# Patient Record
Sex: Male | Born: 1980
Health system: Southern US, Community
[De-identification: ages and names within clinical notes are randomized; demographics above are authoritative.]

## PROBLEM LIST (undated history)

## (undated) DIAGNOSIS — E0500 Thyrotoxicosis with diffuse goiter without thyrotoxic crisis or storm: Secondary | ICD-10-CM

## (undated) DIAGNOSIS — R002 Palpitations: Secondary | ICD-10-CM

## (undated) DIAGNOSIS — E785 Hyperlipidemia, unspecified: Secondary | ICD-10-CM

## (undated) DIAGNOSIS — E039 Hypothyroidism, unspecified: Secondary | ICD-10-CM

## (undated) HISTORY — DX: Thyrotoxicosis with diffuse goiter without thyrotoxic crisis or storm: E05.00

## (undated) HISTORY — PX: CYST REMOVAL TRUNK: SHX6283

## (undated) HISTORY — DX: Hyperlipidemia, unspecified: E78.5

## (undated) HISTORY — DX: Hypothyroidism, unspecified: E03.9

## (undated) HISTORY — DX: Palpitations: R00.2

---

## 2004-06-01 HISTORY — PX: OTHER SURGICAL HISTORY: SHX169

## 2009-05-20 ENCOUNTER — Ambulatory Visit: Payer: Self-pay | Admitting: Cardiology

## 2012-02-02 ENCOUNTER — Emergency Department (HOSPITAL_COMMUNITY)
Admission: EM | Admit: 2012-02-02 | Discharge: 2012-02-03 | Disposition: A | Discharge status: Home / Self Care | Payer: 59 | Attending: Emergency Medicine | Admitting: Emergency Medicine

## 2012-02-02 ENCOUNTER — Encounter (HOSPITAL_COMMUNITY): Payer: Self-pay | Admitting: Emergency Medicine

## 2012-02-02 ENCOUNTER — Emergency Department (HOSPITAL_COMMUNITY)
Admission: EM | Admit: 2012-02-02 | Discharge: 2012-02-02 | Disposition: A | Discharge status: Nursing Home (Includes ICF) | Payer: 59 | Source: Home / Self Care

## 2012-02-02 DIAGNOSIS — T148 Other injury of unspecified body region: Secondary | ICD-10-CM | POA: Insufficient documentation

## 2012-02-02 DIAGNOSIS — W57XXXA Bitten or stung by nonvenomous insect and other nonvenomous arthropods, initial encounter: Secondary | ICD-10-CM | POA: Insufficient documentation

## 2012-02-02 DIAGNOSIS — R51 Headache: Secondary | ICD-10-CM | POA: Insufficient documentation

## 2012-02-02 LAB — BASIC METABOLIC PANEL
BUN: 11 mg/dL (ref 6–23)
CO2: 26 mEq/L (ref 19–32)
Calcium: 9.9 mg/dL (ref 8.4–10.5)
Creatinine, Ser: 1.11 mg/dL (ref 0.50–1.35)
Glucose, Bld: 94 mg/dL (ref 70–99)

## 2012-02-02 LAB — CBC WITH DIFFERENTIAL/PLATELET
Eosinophils Relative: 1 % (ref 0–5)
HCT: 43.5 % (ref 39.0–52.0)
Lymphocytes Relative: 31 % (ref 12–46)
Lymphs Abs: 1.7 10*3/uL (ref 0.7–4.0)
MCV: 87.9 fL (ref 78.0–100.0)
Monocytes Absolute: 0.5 10*3/uL (ref 0.1–1.0)
Monocytes Relative: 10 % (ref 3–12)
RBC: 4.95 MIL/uL (ref 4.22–5.81)
WBC: 5.5 10*3/uL (ref 4.0–10.5)

## 2012-02-02 LAB — HEPATIC FUNCTION PANEL
Albumin: 4.3 g/dL (ref 3.5–5.2)
Alkaline Phosphatase: 43 U/L (ref 39–117)
Total Bilirubin: 0.5 mg/dL (ref 0.3–1.2)

## 2012-02-02 MED ORDER — DOXYCYCLINE HYCLATE 100 MG PO TABS
100.0000 mg | ORAL_TABLET | Freq: Once | ORAL | Status: AC
  Administered 2012-02-02: 100 mg via ORAL
  Filled 2012-02-02: qty 1

## 2012-02-02 MED ORDER — SODIUM CHLORIDE 0.9 % IV SOLN
1000.0000 mL | Freq: Once | INTRAVENOUS | Status: AC
  Administered 2012-02-02: 1000 mL via INTRAVENOUS

## 2012-02-02 MED ORDER — KETOROLAC TROMETHAMINE 30 MG/ML IJ SOLN
30.0000 mg | Freq: Once | INTRAMUSCULAR | Status: AC
  Administered 2012-02-02: 30 mg via INTRAVENOUS
  Filled 2012-02-02: qty 1

## 2012-02-02 MED ORDER — ONDANSETRON HCL 4 MG/2ML IJ SOLN
4.0000 mg | Freq: Once | INTRAMUSCULAR | Status: AC
  Administered 2012-02-02: 4 mg via INTRAVENOUS
  Filled 2012-02-02: qty 2

## 2012-02-02 MED ORDER — SODIUM CHLORIDE 0.9 % IV SOLN: 1000.0000 mL | INTRAVENOUS | Status: DC

## 2012-02-02 MED ORDER — MORPHINE SULFATE 4 MG/ML IJ SOLN
4.0000 mg | Freq: Once | INTRAMUSCULAR | Status: AC
  Administered 2012-02-02: 4 mg via INTRAVENOUS
  Filled 2012-02-02: qty 1

## 2012-02-02 NOTE — ED Notes (Signed)
C/o headache and nausea, backache.  Onset Friday of these symptoms.  Denies vomiting.  No diarrhea.  Reports generalized soreness.  Unknown how high temperature was running.  Denies rash, remembers removing tick from himself in the last 2 weeks.

## 2012-02-02 NOTE — ED Provider Notes (Signed)
History     CSN: [id number]  Arrival date & time 02/02/12  1542   None     Chief Complaint  Patient presents with  . Headache    (Consider location/radiation/quality/duration/timing/severity/associated sxs/prior treatment) HPI Comments: 4 d ago started with a severe global headache f/b neck stiffness , nausea, decreased appetite, body aches, nausea, chills. St headache is worse he has ever, shooting, stabbing, throbbing with photophobia. Never had hx of headaches and considers himself to be in good overall health.   Patient is a 31 y.o. male presenting with headaches.  Headache The primary symptoms include headaches, fever and nausea. Primary symptoms do not include seizures or dizziness.  The headache is associated with photophobia, eye pain and neck stiffness.  Additional symptoms include neck stiffness and photophobia. Additional symptoms do not include hearing loss or tinnitus.    History reviewed. No pertinent past medical history.  Past Surgical History  Procedure Date  . Cyst removed 2006    No family history on file.  History  Substance Use Topics  . Smoking status: Never Smoker   . Smokeless tobacco: Not on file  . Alcohol Use: No      Review of Systems  Constitutional: Positive for fever, chills, activity change, appetite change and fatigue.  HENT: Positive for neck pain and neck stiffness. Negative for hearing loss, ear pain, nosebleeds, congestion, rhinorrhea, sneezing, postnasal drip and tinnitus.   Eyes: Positive for photophobia and pain.  Respiratory: Negative.   Cardiovascular: Negative.   Gastrointestinal: Positive for nausea. Negative for abdominal pain, constipation, blood in stool, abdominal distention and anal bleeding.  Genitourinary: Negative.   Musculoskeletal: Positive for myalgias and back pain.  Neurological: Positive for headaches. Negative for dizziness, seizures, syncope, speech difficulty, light-headedness and numbness.  Hematological:  Negative.   Psychiatric/Behavioral: Negative.     Allergies  Review of patient's allergies indicates no known allergies.  Home Medications   Current Outpatient Rx  Name Route Sig Dispense Refill  . IBUPROFEN 200 MG PO TABS Oral Take 200 mg by mouth every 6 (six) hours as needed.    . IMITREX PO Oral Take by mouth.      BP 99/65  Pulse 84  Temp 98.2 F (36.8 C) (Oral)  Resp 16  SpO2 100%  Physical Exam  Constitutional: He is oriented to person, place, and time. He appears well-developed and well-nourished. No distress.  HENT:  Right Ear: External ear normal.  Left Ear: External ear normal.  Mouth/Throat: Oropharynx is clear and moist.  Eyes: Conjunctivae are normal. Pupils are equal, round, and reactive to light.  Neck: No rigidity. Decreased range of motion present. No edema present. No Brudzinski's sign and no Kernig's sign noted.  Cardiovascular: Normal rate, regular rhythm and normal heart sounds.   Pulmonary/Chest: Breath sounds normal. He is in respiratory distress.  Neurological: He is alert and oriented to person, place, and time. No cranial nerve deficit.  Skin: Skin is warm and dry. He is not diaphoretic.  Psychiatric: He has a normal mood and affect.    ED Course  Procedures (including critical care time)  Labs Reviewed - No data to display No results found.   1. Headache, acute       MDM  Due to headache status, recent tick bite and associated sx's that may give concern for rickettsial illness or meningitis transfer via shuttle to ED. Condition is stable.         Brandon Rasmussen, NP 02/02/12 1704

## 2012-02-02 NOTE — ED Notes (Signed)
Pt sent here for eval for nausea, HA, neck stiffness and fever; pt removed tick in last 2 weeks; no rash noted

## 2012-02-03 MED ORDER — HYDROCODONE-ACETAMINOPHEN 5-500 MG PO TABS: 1.0000 | ORAL_TABLET | Freq: Four times a day (QID) | ORAL | Status: AC | PRN

## 2012-02-03 MED ORDER — DOXYCYCLINE HYCLATE 100 MG PO CAPS: 100.0000 mg | ORAL_CAPSULE | Freq: Two times a day (BID) | ORAL | Status: AC

## 2012-02-03 MED ORDER — OMEPRAZOLE 20 MG PO CPDR: 20.0000 mg | DELAYED_RELEASE_CAPSULE | Freq: Every day | ORAL | Status: DC

## 2012-02-03 NOTE — ED Provider Notes (Signed)
Medical screening examination/treatment/procedure(s) were performed by resident physician or non-physician practitioner and as supervising physician I was immediately available for consultation/collaboration.   Barkley Bruns MD.    Linna Hoff, MD 02/03/12 2130

## 2012-02-03 NOTE — ED Provider Notes (Signed)
History     CSN: [id number]  Arrival date & time 02/02/12  1709   First MD Initiated Contact with Patient 02/02/12 2129      Chief Complaint  Patient presents with  . Fever  . Headache  . Nausea  . Insect Bite    (Consider location/radiation/quality/duration/timing/severity/associated sxs/prior treatment) The history is provided by the patient.   the patient reports developing nausea and headache with fever over the past several days.  He had a tick bite 2 weeks ago.  Said no rash.  He was seen at the urgent care and sent to the emergency department for evaluation for possible meningitis versus [facility] spotted fever.  The patient denies abdominal pain.  He has no chest pain or shortness of breath.  Said no dysuria.  He has no medical problems.  History reviewed. No pertinent past medical history.  Past Surgical History  Procedure Date  . Cyst removed 2006    History reviewed. No pertinent family history.  History  Substance Use Topics  . Smoking status: Never Smoker   . Smokeless tobacco: Not on file  . Alcohol Use: No      Review of Systems  Constitutional: Positive for fever.  Neurological: Positive for headaches.  All other systems reviewed and are negative.    Allergies  Review of patient's allergies indicates no known allergies.  Home Medications   Current Outpatient Rx  Name Route Sig Dispense Refill  . IBUPROFEN 200 MG PO TABS Oral Take 400 mg by mouth every 6 (six) hours as needed. For pain    . DOXYCYCLINE HYCLATE 100 MG PO CAPS Oral Take 1 capsule (100 mg total) by mouth 2 (two) times daily. 20 capsule 0  . HYDROCODONE-ACETAMINOPHEN 5-500 MG PO TABS Oral Take 1 tablet by mouth every 6 (six) hours as needed for pain. 15 tablet 0  . OMEPRAZOLE 20 MG PO CPDR Oral Take 1 capsule (20 mg total) by mouth daily. 30 capsule 0    BP 125/64  Pulse 79  Temp 98.3 F (36.8 C) (Oral)  Resp 18  SpO2 97%  Physical Exam  Nursing note and vitals  reviewed. Constitutional: He is oriented to person, place, and time. He appears well-developed and well-nourished.  HENT:  Head: Normocephalic and atraumatic.  Eyes: EOM are normal.  Neck: Normal range of motion. Neck supple.       No meningeal signs  Cardiovascular: Normal rate, regular rhythm, normal heart sounds and intact distal pulses.   Pulmonary/Chest: Effort normal and breath sounds normal. No respiratory distress.  Abdominal: Soft. He exhibits no distension. There is no tenderness.  Musculoskeletal: Normal range of motion.  Neurological: He is alert and oriented to person, place, and time.  Skin: Skin is warm and dry.  Psychiatric: He has a normal mood and affect. Judgment normal.    ED Course  Procedures (including critical care time)  Labs Reviewed  BASIC METABOLIC PANEL - Abnormal; Notable for the following:    GFR calc non Af Amer 86 (*)     All other components within normal limits  CBC WITH DIFFERENTIAL  HEPATIC FUNCTION PANEL   No results found.   1. Headache   2. Tick bite       MDM  This may represent [facility] spotted fever however he has no rash he has a thrombocytopenia and his liver enzymes are normal.  Regardless the patient be covered with doxycycline for 10 days.  Close PCP followup.  This is  not meningitis.  The patient is well-appearing.  Feels better at this time after fluids and pain medicine        Lyanne Co, MD 02/03/12 [phone]

## 2012-05-23 ENCOUNTER — Encounter: Payer: Self-pay | Admitting: Cardiology

## 2012-06-03 ENCOUNTER — Ambulatory Visit (INDEPENDENT_AMBULATORY_CARE_PROVIDER_SITE_OTHER): Payer: 59 | Admitting: Cardiology

## 2012-06-03 ENCOUNTER — Encounter: Payer: Self-pay | Admitting: Cardiology

## 2012-06-03 VITALS — BP 122/77 | HR 74 | Ht 64.0 in | Wt 173.4 lb

## 2012-06-03 DIAGNOSIS — Z136 Encounter for screening for cardiovascular disorders: Secondary | ICD-10-CM

## 2012-06-03 DIAGNOSIS — R002 Palpitations: Secondary | ICD-10-CM | POA: Insufficient documentation

## 2012-06-03 NOTE — Patient Instructions (Addendum)
Your physician recommends that you schedule a follow-up appointment as needed.   Your physician recommends that you continue on your current medications as directed. Please refer to the Current Medication list given to you today.  

## 2012-06-03 NOTE — Progress Notes (Signed)
   HPI The patient presents for evaluation of palpitations. He has no prior cardiac history. He has had palpitations for years. He thinks this is a stable condition. He has had palpitations sporadically. He may have a day where he has a couple of them and then not have them for a long while. He notices them more when he is less active. He describes isolated skipped beats or a sensation of a pause. However, he doesn't seem to have multiple episodes of this. He's never had any presyncope or syncope. He denies chest pressure, neck or arm discomfort. He's actually intentionally lost 20 pounds. He's had no edema. He actually did a mud race recently without problems.  I did review labs and he had a normal TSH recently and normal electrolytes. I reviewed an ER report from [facility] and he had normal labs. He was being treated for presumed [facility] Fever at that time.  No Known Allergies  Current Outpatient Prescriptions  Medication Sig Dispense Refill  . ibuprofen (ADVIL,MOTRIN) 200 MG tablet Take 400 mg by mouth every 6 (six) hours as needed. For pain        Past Medical History  Diagnosis Date  . Palpitations     Past Surgical History  Procedure Date  . Cyst removed 2006    ROS:  As stated in the HPI and negative for all other systems.  PHYSICAL EXAM BP 122/77  Ht 5\' 4"  (1.626 m)  Wt 173 lb 6.4 oz (78.654 kg)  BMI 29.76 kg/m2 GENERAL:  Well appearing HEENT:  Pupils equal round and reactive, fundi not visualized, oral mucosa unremarkable NECK:  No jugular venous distention, waveform within normal limits, carotid upstroke brisk and symmetric, no bruits, no thyromegaly LYMPHATICS:  No cervical, inguinal adenopathy LUNGS:  Clear to auscultation bilaterally BACK:  No CVA tenderness CHEST:  Unremarkable HEART:  PMI not displaced or sustained,S1 and S2 within normal limits, no S3, no S4, no clicks, no rubs, no murmurs ABD:  Flat, positive bowel sounds normal in frequency in pitch, no  bruits, no rebound, no guarding, no midline pulsatile mass, no hepatomegaly, no splenomegaly EXT:  2 plus pulses throughout, no edema, no cyanosis no clubbing SKIN:  No rashes no nodules NEURO:  Cranial nerves II through XII grossly intact, motor grossly intact throughout PSYCH:  Cognitively intact, oriented to person place and time  EKG:  Sinus rhythm, rate 74, axis within normal limits, intervals within normal limits, no acute ST-T wave changes.  06/03/2012   ASSESSMENT AND PLAN  Palpitations:  I had a long conversation with him about these. These are infrequent. I would otherwise suspect that he has a normal heart. He is to avoid caffeine. Otherwise I would not suggest further cardiovascular workup. He would like to avoid symptomatic treatment at this point. He will however let me know if he has worsening symptoms in the future.  Risk reduction:  I did discuss with him continuing an exercise regimen. Further primary risk reduction will be per Dr. Loney Hering.

## 2017-05-11 ENCOUNTER — Other Ambulatory Visit: Payer: Self-pay

## 2017-05-11 ENCOUNTER — Emergency Department (HOSPITAL_COMMUNITY): Payer: Worker's Compensation

## 2017-05-11 ENCOUNTER — Emergency Department (HOSPITAL_COMMUNITY)
Admission: EM | Admit: 2017-05-11 | Discharge: 2017-05-11 | Disposition: A | Discharge status: Home / Self Care | Payer: Worker's Compensation | Attending: Emergency Medicine | Admitting: Emergency Medicine

## 2017-05-11 ENCOUNTER — Encounter (HOSPITAL_COMMUNITY): Payer: Self-pay | Admitting: Emergency Medicine

## 2017-05-11 DIAGNOSIS — Y9301 Activity, walking, marching and hiking: Secondary | ICD-10-CM | POA: Insufficient documentation

## 2017-05-11 DIAGNOSIS — W010XXA Fall on same level from slipping, tripping and stumbling without subsequent striking against object, initial encounter: Secondary | ICD-10-CM | POA: Diagnosis not present

## 2017-05-11 DIAGNOSIS — S42021A Displaced fracture of shaft of right clavicle, initial encounter for closed fracture: Secondary | ICD-10-CM | POA: Diagnosis not present

## 2017-05-11 DIAGNOSIS — Y990 Civilian activity done for income or pay: Secondary | ICD-10-CM | POA: Diagnosis not present

## 2017-05-11 DIAGNOSIS — Y9289 Other specified places as the place of occurrence of the external cause: Secondary | ICD-10-CM | POA: Insufficient documentation

## 2017-05-11 DIAGNOSIS — S4991XA Unspecified injury of right shoulder and upper arm, initial encounter: Secondary | ICD-10-CM | POA: Diagnosis present

## 2017-05-11 MED ORDER — ONDANSETRON 4 MG PO TBDP: 4.0000 mg | ORAL_TABLET | Freq: Three times a day (TID) | ORAL | 0 refills | Status: DC | PRN

## 2017-05-11 MED ORDER — HYDROCODONE-ACETAMINOPHEN 5-325 MG PO TABS: 1.0000 | ORAL_TABLET | ORAL | 0 refills | Status: DC | PRN

## 2017-05-11 MED ORDER — OXYCODONE-ACETAMINOPHEN 5-325 MG PO TABS
2.0000 | ORAL_TABLET | ORAL | Status: DC | PRN
Start: 2017-05-11 — End: 2017-05-11

## 2017-05-11 MED ORDER — OXYCODONE-ACETAMINOPHEN 5-325 MG PO TABS
1.0000 | ORAL_TABLET | ORAL | Status: DC | PRN
  Administered 2017-05-11: 1 via ORAL
  Filled 2017-05-11: qty 1

## 2017-05-11 MED FILL — ONDANSETRON ODT 4 MG TABLET: 4 | 7 days supply | Qty: 20 | Fill #0

## 2017-05-11 MED FILL — HYDROCODON-APAP 5-325: 5-325 | 3 days supply | Qty: 15 | Fill #0

## 2017-05-11 NOTE — ED Triage Notes (Signed)
Patient from work where he slipped on ice and fell on his right shoulder. Apparent deformity at right clavicle, unable to lift right arm due to pain. PMS intact distal to shoulder. Denies other complains, no LOC. Patient alert and oriented and no in apparent distress at this time.

## 2017-05-11 NOTE — ED Notes (Signed)
Ortho tech paged for arm sling. 

## 2017-05-11 NOTE — ED Provider Notes (Signed)
[facility]   CSN: [id number] Arrival date & time: 05/11/17  0740     History   Chief Complaint Chief Complaint  Patient presents with  . Fall    HPI Maxwell Marionimothy L Wrobleski is a 36 y.o. male.  HPI   Very pleasant 36 year old male here with right shoulder pain after fall.  The patient was walking into work at his workplace when he slipped, falling onto his right shoulder.  He reports a popping sensation and immediate onset of shoulder deformity and pain.  The pain is aching, throbbing, worse with any movement or palpation.  He denies any associated numbness or tingling down his arm.  He did have some transient tingling in his ulnar nerve distribution, but denies any elbow pain.  Denies any neck pain.  He did not hit his head or lose consciousness.  Denies any illness prior to the fall.  The fall was mechanical in nature.  He is not on blood thinners.  No other medical complaints or issues.  Past Medical History:  Diagnosis Date  . Palpitations     Patient Active Problem List   Diagnosis Date Noted  . Palpitations 06/03/2012    Past Surgical History:  Procedure Laterality Date  . Cyst removed  2006       Home Medications    Prior to Admission medications   Medication Sig Start Date End Date Taking? Authorizing Provider  HYDROcodone-acetaminophen (NORCO/VICODIN) 5-325 MG tablet Take 1-2 tablets by mouth every 4 (four) hours as needed for severe pain. 05/11/17   Shaune PollackIsaacs, Marilene Vath, MD  ondansetron (ZOFRAN ODT) 4 MG disintegrating tablet Take 1 tablet (4 mg total) by mouth every 8 (eight) hours as needed for nausea or vomiting. 05/11/17   Shaune PollackIsaacs, Jas Betten, MD    Family History Family History  Problem Relation Age of Onset  . Thyroid disease Mother     Social History Social History   Tobacco Use  . Smoking status: Never Smoker  Substance Use Topics  . Alcohol use: No  . Drug use: No     Allergies   Patient has no  known allergies.   Review of Systems Review of Systems  Constitutional: Negative for chills, fatigue and fever.  HENT: Negative for congestion and rhinorrhea.   Eyes: Negative for visual disturbance.  Respiratory: Negative for cough, shortness of breath and wheezing.   Cardiovascular: Negative for chest pain and leg swelling.  Gastrointestinal: Negative for abdominal pain, diarrhea, nausea and vomiting.  Genitourinary: Negative for dysuria and flank pain.  Musculoskeletal: Positive for arthralgias and myalgias. Negative for neck pain and neck stiffness.  Skin: Negative for rash and wound.  Allergic/Immunologic: Negative for immunocompromised state.  Neurological: Negative for syncope, weakness and headaches.  All other systems reviewed and are negative.    Physical Exam Updated Vital Signs BP 110/84   Pulse 74   Temp (!) 97.2 F (36.2 C) (Oral)   Resp 12   SpO2 98%   Physical Exam  Constitutional: He is oriented to person, place, and time. He appears well-developed and well-nourished. No distress.  HENT:  Head: Normocephalic and atraumatic.  Eyes: Conjunctivae are normal.  Neck: Neck supple.  Cardiovascular: Normal rate, regular rhythm and normal heart sounds. Exam reveals no friction rub.  No murmur heard. Pulmonary/Chest: Effort normal and breath sounds normal. No respiratory distress. He has no wheezes. He has no rales. He exhibits no tenderness (No chest wall tenderness).  Abdominal: He exhibits no distension.  Musculoskeletal: He exhibits no edema.  Neurological: He is alert and oriented to person, place, and time. He exhibits normal muscle tone.  Skin: Skin is warm. Capillary refill takes less than 2 seconds.  Psychiatric: He has a normal mood and affect.  Nursing note and vitals reviewed.   UPPER EXTREMITY EXAM: Right  INSPECTION & PALPATION: There is mild deformity to the right mid and distal clavicle.  Skin is not tenting and there are no open wounds.   Minimal ecchymoses.  Range of motion full but mildly painful at the shoulder.  No elbow pain or tenderness.  SENSORY: Sensation is intact to light touch in:  Superficial radial nerve distribution (dorsal first web space) Median nerve distribution (tip of index finger)   Ulnar nerve distribution (tip of small finger)     MOTOR:  + Motor posterior interosseous nerve (thumb IP extension) + Anterior interosseous nerve (thumb IP flexion, index finger DIP flexion) + Radial nerve (wrist extension) + Median nerve (palpable firing thenar mass) + Ulnar nerve (palpable firing of first dorsal interosseous muscle)  VASCULAR: 2+ radial pulse Brisk capillary refill < 2 sec, fingers warm and well-perfused   ED Treatments / Results  Labs (all labs ordered are listed, but only abnormal results are displayed) Labs Reviewed - No data to display  EKG  EKG Interpretation  Date/Time:  Tuesday May 11 2017 09:09:40 EST Ventricular Rate:  74 PR Interval:    QRS Duration: 86 QT Interval:  365 QTC Calculation: 405 R Axis:   75 Text Interpretation:  Sinus rhythm Confirmed by Shaune PollackIsaacs, Meryl Ponder [phone]([id number]) on 05/11/2017 9:22:19 AM       Radiology Dg Chest 2 View  Result Date: 05/11/2017 CLINICAL DATA:  Patient fell on ice today landing on the right shoulder. EXAM: CHEST  2 VIEW COMPARISON:  Chest x-ray of November 18, 2016 and right shoulder series of today's date FINDINGS: The patient has sustained an acute mildly displaced midshaft right clavicular fracture. The observed ribs are intact. The observed portions of the right shoulder otherwise appears normal. The lungs are well-expanded and clear. The heart and pulmonary vascularity are normal. There is no pleural effusion. The observed thoracic spine exhibits no acute abnormality. IMPRESSION: Acute midshaft right clavicular fracture with displacement of approximately 1/5 shaft width. No acute cardiopulmonary abnormality. Electronically Signed   By: David   [place]azil[place]Jordan M.D.   On: 05/11/2017 08:36   Dg Shoulder Right  Result Date: 05/11/2017 CLINICAL DATA:  Larey SeatFell on ice today. EXAM: RIGHT SHOULDER - 2+ VIEW COMPARISON:  Chest x-ray of today's date FINDINGS: There is an acute fracture of the midshaft of the right clavicle. It is comminuted and exhibits minimal displacement. The proximal and distal aspects of the clavicle are intact. The ribs exhibit no acute abnormalities. There is no pneumothorax. The glenohumeral and AC joints appear normal. The humeral head and proximal shaft are intact. IMPRESSION: There is an acute minimally displaced midshaft right clavicular fracture. Electronically Signed   By: David  [place] M.D.   On: 05/11/2017 08:37    Procedures Procedures (including critical care time)  Medications Ordered in ED Medications  oxyCODONE-acetaminophen (PERCOCET/ROXICET) 5-325 MG per tablet 2 tablet (not administered)     Initial Impression / Assessment and Plan / ED Course  I have reviewed the triage vital signs and the nursing notes.  Pertinent labs & imaging results that were available during my care of the patient were reviewed by me and considered in my medical decision making (see chart for details).  36 year old male with past medical history as above who presents with right shoulder pain after mechanical fall.  He is neurovascularly intact.  Imaging shows minimally displaced mid shaft right clavicular fracture.  Will place him in a sling and refer to orthopedics.  No signs of neurovascular compromise.  Chest x-ray is clear.  He does report transient history of palpitations but EKG is nonischemic.  Will have him follow-up with the PCP.  Final Clinical Impressions(s) / ED Diagnoses   Final diagnoses:  Closed displaced fracture of shaft of right clavicle, initial encounter    ED Discharge Orders        Ordered    HYDROcodone-acetaminophen (NORCO/VICODIN) 5-325 MG tablet  Every 4 hours PRN     05/11/17 0939    ondansetron  (ZOFRAN ODT) 4 MG disintegrating tablet  Every 8 hours PRN     05/11/17 0939       Shaune Pollack, MD 05/11/17 [phone]

## 2017-05-11 NOTE — Progress Notes (Signed)
Orthopedic Tech Progress Note Patient Details:  Brandon Chase 21-Feb-1981 [id number]  Ortho Devices Type of Ortho Device: Arm sling Ortho Device/Splint Interventions: Application   Post Interventions Patient Tolerated: Well Instructions Provided: Care of device   Saul FordyceJennifer C Fredrica Capano 05/11/2017, 8:23 AM

## 2017-05-11 NOTE — ED Notes (Signed)
Patient decline ice at this time due to pain

## 2018-08-26 ENCOUNTER — Ambulatory Visit: Payer: 59 | Admitting: Allergy & Immunology

## 2019-10-04 ENCOUNTER — Encounter: Payer: Self-pay | Admitting: Allergy & Immunology

## 2019-10-04 ENCOUNTER — Ambulatory Visit (INDEPENDENT_AMBULATORY_CARE_PROVIDER_SITE_OTHER): Payer: 59 | Admitting: Allergy & Immunology

## 2019-10-04 ENCOUNTER — Other Ambulatory Visit: Payer: Self-pay

## 2019-10-04 VITALS — BP 116/86 | HR 88 | Temp 98.7°F | Resp 18 | Ht 64.0 in | Wt 172.8 lb

## 2019-10-04 DIAGNOSIS — J3089 Other allergic rhinitis: Secondary | ICD-10-CM

## 2019-10-04 DIAGNOSIS — J302 Other seasonal allergic rhinitis: Secondary | ICD-10-CM | POA: Diagnosis not present

## 2019-10-04 MED ORDER — AZELASTINE HCL 0.1 % NA SOLN
2.0000 | Freq: Two times a day (BID) | NASAL | 5 refills | Status: DC
Start: 2019-10-04 — End: 2021-08-28

## 2019-10-04 MED ORDER — MONTELUKAST SODIUM 10 MG PO TABS: 10.0000 mg | ORAL_TABLET | Freq: Every day | ORAL | 5 refills | Status: DC

## 2019-10-04 NOTE — Patient Instructions (Addendum)
1. Seasonal and perennial allergic rhinitis - Testing today showed: grasses, ragweed, weeds, trees, indoor molds, outdoor molds and cat - Copy of test results provided.  - Avoidance measures provided. - Continue with: Flonase (fluticasone) one spray per nostril daily and Singulair (montelukast) 10mg  daily - Start taking: Singulair (montelukast) 10mg  daily and Astelin (azelastine) 2 sprays per nostril 1-2 times daily as needed - You can use an extra dose of the antihistamine, if needed, for breakthrough symptoms.  - Consider nasal saline rinses 1-2 times daily to remove allergens from the nasal cavities as well as help with mucous clearance (this is especially helpful to do before the nasal sprays are given) - Consider allergy shots as a means of long-term control. - Allergy shots "re-train" and "reset" the immune system to ignore environmental allergens and decrease the resulting immune response to those allergens (sneezing, itchy watery eyes, runny nose, nasal congestion, etc).    - Allergy shots improve symptoms in 75-85% of patients.  - Call your insurance company about coverage and make an appointment to start allergy shots.  2. Return in about 6 weeks (around 11/15/2019). This can be an in-person, a virtual Webex or a telephone follow up visit.   Please inform us of any Emergency Department visits, hospitalizations, or changes in symptoms. Call us before going to the ED for breathing or allergy symptoms since we might be able to fit you in for a sick visit. Feel free to contact us anytime with any questions, problems, or concerns.  It was a pleasure to meet you today!  Websites that have reliable patient information: 1. American Academy of Asthma, Allergy, and Immunology: www.aaaai.org 2. Food Allergy Research and Education (FARE): foodallergy.org 3. Mothers of Asthmatics: http://www.asthmacommunitynetwork.org 4. American College of Allergy, Asthma, and Immunology:  www.acaai.org   COVID-19 Vaccine Information can be found at: [email or web address] For questions related to vaccine distribution or appointments, please email [email or web address] or call [phone].     "Like" [place] on Facebook and Instagram for our latest updates!       HAPPY SPRING!  Make sure you are registered to vote! If you have moved or changed any of your contact information, you will need to get this updated before voting!  In some cases, you MAY be able to register to vote online: [email or web address]    Reducing Pollen Exposure  The American Academy of Allergy, Asthma and Immunology suggests the following steps to reduce your exposure to pollen during allergy seasons.    1. Do not hang sheets or clothing out to dry; pollen may collect on these items. 2. Do not mow lawns or spend time around freshly cut grass; mowing stirs up pollen. 3. Keep windows closed at night.  Keep car windows closed while driving. 4. Minimize morning activities outdoors, a time when pollen counts are usually at their highest. 5. Stay indoors as much as possible when pollen counts or humidity is high and on windy days when pollen tends to remain in the air longer. 6. Use air conditioning when possible.  Many air conditioners have filters that trap the pollen spores. 7. Use a HEPA room air filter to remove pollen form the indoor air you breathe.  Control of Mold Allergen   Mold and fungi can grow on a variety of surfaces provided certain temperature and moisture conditions exist.  Outdoor molds grow on plants, decaying vegetation and soil.  The major outdoor mold, Alternaria and Cladosporium, are found in very high numbers during hot and dry conditions.  Generally, a late Summer - Fall peak is seen for common outdoor fungal spores.  Rain will temporarily lower outdoor mold spore count, but counts rise rapidly when  the rainy period ends.  The most important indoor molds are Aspergillus and Penicillium.  Dark, humid and poorly ventilated basements are ideal sites for mold growth.  The next most common sites of mold growth are the bathroom and the kitchen.  Outdoor (Seasonal) Mold Control  Positive outdoor molds via skin testing: Alternaria, Cladosporium, Bipolaris (Helminthsporium), Drechslera (Curvalaria) and Mucor  1. Use air conditioning and keep windows closed 2. Avoid exposure to decaying vegetation. 3. Avoid leaf raking. 4. Avoid grain handling. 5. Consider wearing a face mask if working in moldy areas.  6.   Indoor (Perennial) Mold Control   Positive indoor molds via skin testing: Aspergillus, Penicillium, Fusarium, Aureobasidium (Pullulara) and Rhizopus  1. Maintain humidity below 50%. 2. Clean washable surfaces with 5% bleach solution. 3. Remove sources e.g. contaminated carpets.    Control of Dog or Cat Allergen  Avoidance is the best way to manage a dog or cat allergy. If you have a dog or cat and are allergic to dog or cats, consider removing the dog or cat from the home. If you have a dog or cat but don't want to find it a new home, or if your family wants a pet even though someone in the household is allergic, here are some strategies that may help keep symptoms at bay:  1. Keep the pet out of your bedroom and restrict it to only a few rooms. Be advised that keeping the dog or cat in only one room will not limit the allergens to that room. 2. Don't pet, hug or kiss the dog or cat; if you do, wash your hands with soap and water. 3. High-efficiency particulate air (HEPA) cleaners run continuously in a bedroom or living room can reduce allergen levels over time. 4. Regular use of a high-efficiency vacuum cleaner or a central vacuum can reduce allergen levels. 5. Giving your dog or cat a bath at least once a week can reduce airborne allergen.  Allergy Shots   Allergies are the  result of a chain reaction that starts in the immune system. Your immune system controls how your body defends itself. For instance, if you have an allergy to pollen, your immune system identifies pollen as an invader or allergen. Your immune system overreacts by producing antibodies called Immunoglobulin E (IgE). These antibodies travel to cells that release chemicals, causing an allergic reaction.  The concept behind allergy immunotherapy, whether it is received in the form of shots or tablets, is that the immune system can be desensitized to specific allergens that trigger allergy symptoms. Although it requires time and patience, the payback can be long-term relief.  How Do Allergy Shots Work?  Allergy shots work much like a vaccine. Your body responds to injected amounts of a particular allergen given in increasing doses, eventually developing a resistance and tolerance to it. Allergy shots can lead to decreased, minimal or no allergy symptoms.  There generally are two phases: build-up and maintenance. Build-up often ranges from three to six months and involves receiving injections with increasing amounts of the allergens. The shots are typically given once or twice a week, though more rapid build-up schedules are sometimes used.  The maintenance phase begins when the most effective dose is reached. This dose is different for each person, depending on how allergic you are and your response  to the build-up injections. Once the maintenance dose is reached, there are longer periods between injections, typically two to four weeks.  Occasionally doctors give cortisone-type shots that can temporarily reduce allergy symptoms. These types of shots are different and should not be confused with allergy immunotherapy shots.  Who Can Be Treated with Allergy Shots?  Allergy shots may be a good treatment approach for people with allergic rhinitis (hay fever), allergic asthma, conjunctivitis (eye allergy) or  stinging insect allergy.   Before deciding to begin allergy shots, you should consider:  . The length of allergy season and the severity of your symptoms . Whether medications and/or changes to your environment can control your symptoms . Your desire to avoid long-term medication use . Time: allergy immunotherapy requires a major time commitment . Cost: may vary depending on your insurance coverage  Allergy shots for children age 31 and older are effective and often well tolerated. They might prevent the onset of new allergen sensitivities or the progression to asthma.  Allergy shots are not started on patients who are pregnant but can be continued on patients who become pregnant while receiving them. In some patients with other medical conditions or who take certain common medications, allergy shots may be of risk. It is important to mention other medications you talk to your allergist.   When Will I Feel Better?  Some may experience decreased allergy symptoms during the build-up phase. For others, it may take as long as 12 months on the maintenance dose. If there is no improvement after a year of maintenance, your allergist will discuss other treatment options with you.  If you aren't responding to allergy shots, it may be because there is not enough dose of the allergen in your vaccine or there are missing allergens that were not identified during your allergy testing. Other reasons could be that there are high levels of the allergen in your environment or major exposure to non-allergic triggers like tobacco smoke.  What Is the Length of Treatment?  Once the maintenance dose is reached, allergy shots are generally continued for three to five years. The decision to stop should be discussed with your allergist at that time. Some people may experience a permanent reduction of allergy symptoms. Others may relapse and a longer course of allergy shots can be considered.  What Are the Possible  Reactions?  The two types of adverse reactions that can occur with allergy shots are local and systemic. Common local reactions include very mild redness and swelling at the injection site, which can happen immediately or several hours after. A systemic reaction, which is less common, affects the entire body or a particular body system. They are usually mild and typically respond quickly to medications. Signs include increased allergy symptoms such as sneezing, a stuffy nose or hives.  Rarely, a serious systemic reaction called anaphylaxis can develop. Symptoms include swelling in the throat, wheezing, a feeling of tightness in the chest, nausea or dizziness. Most serious systemic reactions develop within 30 minutes of allergy shots. This is why it is strongly recommended you wait in your doctor's office for 30 minutes after your injections. Your allergist is trained to watch for reactions, and his or her staff is trained and equipped with the proper medications to identify and treat them.  Who Should Administer Allergy Shots?  The preferred location for receiving shots is your prescribing allergist's office. Injections can sometimes be given at another facility where the physician and staff are trained to recognize  and treat reactions, and have received instructions by your prescribing allergist.

## 2019-10-04 NOTE — Progress Notes (Signed)
NEW PATIENT  Date of Service/Encounter:  10/04/19  Referring provider: Ernestine Conrad, MD   Assessment:   Seasonal and perennial allergic rhinitis (grasses, ragweed, weeds, trees, indoor molds, outdoor molds and cat)  Plan/Recommendations:   1. Seasonal and perennial allergic rhinitis - Testing today showed: grasses, ragweed, weeds, trees, indoor molds, outdoor molds and cat - Copy of test results provided.  - Avoidance measures provided. - Continue with: Flonase (fluticasone) one spray per nostril daily and Singulair (montelukast) 10mg  daily - Start taking: Singulair (montelukast) 10mg  daily and Astelin (azelastine) 2 sprays per nostril 1-2 times daily as needed - You can use an extra dose of the antihistamine, if needed, for breakthrough symptoms.  - Consider nasal saline rinses 1-2 times daily to remove allergens from the nasal cavities as well as help with mucous clearance (this is especially helpful to do before the nasal sprays are given) - Consider allergy shots as a means of long-term control. - Allergy shots "re-train" and "reset" the immune system to ignore environmental allergens and decrease the resulting immune response to those allergens (sneezing, itchy watery eyes, runny nose, nasal congestion, etc).    - Allergy shots improve symptoms in 75-85% of patients.  - Call your insurance company about coverage and make an appointment to start allergy shots.  2. Return in about 6 weeks (around 11/15/2019). This can be an in-person, a virtual Webex or a telephone follow up visit.  Subjective:   Brandon Chase is a 39 y.o. male presenting today for evaluation of  Chief Complaint  Patient presents with  . Allergic Rhinitis     Brandon Chase has a history of the following: Patient Active Problem List   Diagnosis Date Noted  . Palpitations 06/03/2012    History obtained from: chart review and patient.  Maxwell Marion was referred by 08/01/2012, MD.     Brandon Chase is  a 39 y.o. male presenting for an evaluation of environmental allergies.  Patient tells me that he has had allergies for his entire life.  The spring, summer, and fall are definitely the worst.  Symptoms almost resolved during the winter.  He has tried a multitude of different antihistamines, but tends to have marked sedation to all of them, including Allegra.  He is using fluticasone now with some good results.  This year has been somewhat better, likely because of the mask.  He also has worse symptoms when he visits his parents.  His parents have 2 cats, but he also has 2 cats at his house.  He is not sure what makes the symptoms worse at his parents house.  He has never been allergy tested and has never been on allergen immunotherapy.  He will occasionally get some sinus infections during these episodes.  Otherwise, there is no history of other atopic diseases, including asthma, food allergies, drug allergies, stinging insect allergies, eczema, urticaria or contact dermatitis. There is no significant infectious history. Vaccinations are up to date.    Past Medical History: Patient Active Problem List   Diagnosis Date Noted  . Palpitations 06/03/2012    Medication List:  Allergies as of 10/04/2019   No Known Allergies     Medication List       Accurate as of Oct 04, 2019  3:48 PM. If you have any questions, ask your nurse or doctor.        STOP taking these medications   HYDROcodone-acetaminophen 5-325 MG tablet Commonly known as: NORCO/VICODIN Stopped by: 12/04/2019  Emeline Darling, MD   ondansetron 4 MG disintegrating tablet Commonly known as: Zofran ODT Stopped by: Alfonse Spruce, MD     TAKE these medications   albuterol 108 (90 Base) MCG/ACT inhaler Commonly known as: VENTOLIN HFA Inhale into the lungs.   ALPRAZolam 0.25 MG tablet Commonly known as: XANAX Take by mouth.   azelastine 0.1 % nasal spray Commonly known as: ASTELIN Place 2 sprays into both nostrils 2 (two)  times daily. Started by: Alfonse Spruce, MD   clonazePAM 0.5 MG tablet Commonly known as: KLONOPIN Take by mouth.   fluticasone 50 MCG/ACT nasal spray Commonly known as: FLONASE 1 spray by Each Nare route daily.   montelukast 10 MG tablet Commonly known as: SINGULAIR Take 1 tablet (10 mg total) by mouth at bedtime.       Birth History: non-contributory  Developmental History: non-contributory  Past Surgical History: Past Surgical History:  Procedure Laterality Date  . Cyst removed  2006     Family History: Family History  Problem Relation Age of Onset  . Thyroid disease Mother      Social History: Bell lives at home with his family.  He works as an Personnel officer at [employer].  They live in a house that is 41 years old.  There is hardwood throughout the home..  Is electric heating and central cooling.  There is 1 dog in the home as well has cats and chickens outside of the home.  There are dust mite covers on the bed, but not the pillows.  There is no tobacco exposure.  He does use a HEPA filter in the home.  He does not live near an interstate or industrial area.  He is not a smoker.   Review of Systems  Constitutional: Negative.  Negative for chills, fever, malaise/fatigue and weight loss.  HENT: Positive for congestion. Negative for ear discharge and ear pain.        Positive for postnasal drip.  Eyes: Negative for pain, discharge and redness.  Respiratory: Negative for cough, sputum production, shortness of breath and wheezing.   Cardiovascular: Negative.  Negative for chest pain and palpitations.  Gastrointestinal: Negative for abdominal pain, constipation, diarrhea, heartburn, nausea and vomiting.  Skin: Negative.  Negative for itching and rash.  Neurological: Negative for dizziness and headaches.  Endo/Heme/Allergies: Negative for environmental allergies. Does not bruise/bleed easily.       Objective:   Blood pressure 116/86, pulse 88,  temperature 98.7 F (37.1 C), temperature source Temporal, resp. rate 18, height 5\' 4"  (1.626 m), weight 172 lb 12.8 oz (78.4 kg), SpO2 98 %. Body mass index is 29.66 kg/m.   Physical Exam:   Physical Exam  Constitutional: He appears well-developed.  Pleasant male.  Cooperative with the exam.  HENT:  Head: Normocephalic and atraumatic.  Right Ear: Tympanic membrane, external ear and ear canal normal. No drainage, swelling or tenderness. Tympanic membrane is not injected, not scarred, not erythematous, not retracted and not bulging.  Left Ear: Tympanic membrane, external ear and ear canal normal. No drainage, swelling or tenderness. Tympanic membrane is not injected, not scarred, not erythematous, not retracted and not bulging.  Nose: Mucosal edema and rhinorrhea present. No nasal deformity or septal deviation. No epistaxis. Right sinus exhibits no maxillary sinus tenderness and no frontal sinus tenderness. Left sinus exhibits no maxillary sinus tenderness and no frontal sinus tenderness.  Mouth/Throat: Uvula is midline and oropharynx is clear and moist. Mucous membranes are not pale and not  dry.  Tonsils normal size bilaterally.  Eyes: Pupils are equal, round, and reactive to light. Conjunctivae and EOM are normal. Right eye exhibits no chemosis and no discharge. Left eye exhibits no chemosis and no discharge. Right conjunctiva is not injected. Left conjunctiva is not injected.  Cardiovascular: Normal rate, regular rhythm and normal heart sounds.  Respiratory: Effort normal and breath sounds normal. No accessory muscle usage. No tachypnea. No respiratory distress. He has no wheezes. He has no rhonchi. He has no rales. He exhibits no tenderness.  Moving air well in all lung fields.  GI: There is no abdominal tenderness. There is no rebound and no guarding.  Lymphadenopathy:       Head (right side): No submandibular, no tonsillar and no occipital adenopathy present.       Head (left side): No  submandibular, no tonsillar and no occipital adenopathy present.    He has no cervical adenopathy.  Neurological: He is alert.  Skin: No abrasion, no petechiae and no rash noted. Rash is not papular, not vesicular and not urticarial. No erythema. No pallor.  No eczematous or urticarial lesions.  Psychiatric: He has a normal mood and affect.     Diagnostic studies:     Allergy Studies:    Airborne Adult Perc - 10/04/19 1359    Time Antigen Placed  1359    Allergen Manufacturer  Lavella Hammock    Location  Back    Number of Test  59    Panel 1  Select    1. Control-Buffer 50% Glycerol  Negative    2. Control-Histamine 1 mg/ml  2+    3. Albumin saline  Negative    4. Page  4+    5. [place]  4+    6. Johnson  4+    7. Palmer  4+    8. Meadow Fescue  2+    9. Perennial Rye  3+    10. Sweet Vernal  3+    11. Andreas  3+    12. Cocklebur  Negative    13. Burweed Marshelder  Negative    14. Ragweed, short  3+    15. Ragweed, Giant  4+    16. Plantain,  English  2+    17. Lamb's Quarters  2+    18. Sheep Sorrell  2+    19. Rough Pigweed  2+    20. Marsh Elder, Rough  Negative    21. Mugwort, Common  2+    22. Ash mix  Negative    23. Birch mix  Negative    24. Beech American  Negative    25. Box, Elder  3+    26. Cedar, red  Negative    27. Cottonwood, Eastern  2+    28. Elm mix  2+    29. Hickory  3+    30. Maple mix  2+    31. Oak, [place] mix  4+    32. Pecan Pollen  4+    33. Pine mix  Negative    34. Sycamore Eastern  Negative    35. [place], Black Pollen  4+    36. Alternaria alternata  Negative    37. Cladosporium Herbarum  Negative    38. Aspergillus mix  Negative    39. Penicillium mix  Negative    40. Bipolaris sorokiniana (Helminthosporium)  Negative    41. Drechslera spicifera (Curvularia)  Negative    42. Mucor plumbeus  Negative  43. Fusarium moniliforme  Negative    44. Aureobasidium pullulans (pullulara)  Negative    45. Rhizopus oryzae  Negative     46. Botrytis cinera  Negative    47. Epicoccum nigrum  Negative    48. Phoma betae  Negative    49. Candida Albicans  Negative    50. Trichophyton mentagrophytes  Negative    51. Mite, D Farinae  5,000 AU/ml  Negative    52. Mite, D Pteronyssinus  5,000 AU/ml  Negative    53. Cat Hair 10,000 BAU/ml  Negative    54.  Dog Epithelia  Negative    55. Mixed Feathers  Negative    56. Horse Epithelia  Negative    57. Cockroach, German  Negative    58. Mouse  Negative    59. Tobacco Leaf  Negative     Intradermal - 10/04/19 1430    Time Antigen Placed  1430    Allergen Manufacturer  Waynette Buttery    Location  Back    Number of Test  9    Intradermal  Select    Control  Negative    Mold 1  2+    Mold 2  1+    Mold 3  3+    Mold 4  4+    Cat  3+    Dog  Negative    Cockroach  Negative    Mite mix  Negative       Allergy testing results were read and interpreted by myself, documented by clinical staff.         Malachi Bonds, MD [facility]

## 2019-11-17 ENCOUNTER — Ambulatory Visit: Payer: 59 | Admitting: Allergy & Immunology

## 2021-08-15 LAB — TSH: TSH: 0.01 — AB (ref 0.41–5.90)

## 2021-08-28 ENCOUNTER — Encounter: Payer: Self-pay | Admitting: Nurse Practitioner

## 2021-08-28 ENCOUNTER — Ambulatory Visit (INDEPENDENT_AMBULATORY_CARE_PROVIDER_SITE_OTHER): Payer: BLUE CROSS/BLUE SHIELD | Admitting: Nurse Practitioner

## 2021-08-28 VITALS — BP 97/62 | HR 78 | Ht 64.3 in | Wt 168.4 lb

## 2021-08-28 DIAGNOSIS — E0590 Thyrotoxicosis, unspecified without thyrotoxic crisis or storm: Secondary | ICD-10-CM | POA: Diagnosis not present

## 2021-08-28 NOTE — Patient Instructions (Signed)
Hyperthyroidism  Hyperthyroidism is when the thyroid gland is too active (overactive). The thyroid gland is a small gland located in the lower front part of the neck, just in front of the windpipe (trachea). This gland makes hormones that help control how the body uses food for energy (metabolism) as well as how the heart and brain function. These hormones also play a role in keeping your bones strong. When the thyroid is overactive, it produces toomuch of a hormone called thyroxine. What are the causes? This condition may be caused by: Graves' disease. This is a disorder in which the body's disease-fighting system (immune system) attacks the thyroid gland. This is the most common cause. Inflammation of the thyroid gland. A tumor in the thyroid gland. Use of certain medicines, including: Prescription thyroid hormone replacement. Herbal supplements that mimic thyroid hormones. Amiodarone therapy. Solid or fluid-filled lumps within your thyroid gland (thyroid nodules). Taking in a large amount of iodine from foods or medicines. What increases the risk? You are more likely to develop this condition if: You are male. You have a family history of thyroid conditions. You smoke tobacco. You use a medicine called lithium. You take medicines that affect the immune system (immunosuppressants). What are the signs or symptoms? Symptoms of this condition include: Nervousness. Inability to tolerate heat. Unexplained weight loss. Diarrhea. Change in the texture of hair or skin. Heart skipping beats or making extra beats. Rapid heart rate. Loss of menstruation. Shaky hands. Fatigue. Restlessness. Sleep problems. Enlarged thyroid gland or a lump in the thyroid (nodule). You may also have symptoms of Graves' disease, which may include: Protruding eyes. Dry eyes. Red or swollen eyes. Problems with vision. How is this diagnosed? This condition may be diagnosed based on: Your symptoms and  medical history. A physical exam. Blood tests. Thyroid ultrasound. This test involves using sound waves to produce images of the thyroid gland. A thyroid scan. A radioactive substance is injected into a vein, and images show how much iodine is present in the thyroid. Radioactive iodine uptake test (RAIU). A small amount of radioactive iodine is given by mouth to see how much iodine the thyroid absorbs after a certain amount of time. How is this treated? Treatment depends on the cause and severity of the condition. Treatment may include: Medicines to reduce the amount of thyroid hormone your body makes. Radioactive iodine treatment (radioiodine therapy). This involves swallowing a small dose of radioactive iodine, in capsule or liquid form, to kill thyroid cells. Surgery to remove part or all of your thyroid gland. You may need to take thyroid hormone replacement medicine for the rest of your life after thyroid surgery. Medicines to help manage your symptoms. Follow these instructions at home:  Take over-the-counter and prescription medicines only as told by your health care provider. Do not use any products that contain nicotine or tobacco, such as cigarettes and e-cigarettes. If you need help quitting, ask your health care provider. Follow any instructions from your health care provider about diet. You may be instructed to limit foods that contain iodine. Keep all follow-up visits as told by your health care provider. This is important. You will need to have blood tests regularly so that your health care provider can monitor your condition. Contact a health care provider if: Your symptoms do not get better with treatment. You have a fever. You are taking thyroid hormone replacement medicine and you: Have symptoms of depression. Feel like you are tired all the time. Gain weight. Get help right   away if: You have chest pain. You have decreased alertness or a change in your awareness. You  have abdominal pain. You feel dizzy. You have a rapid heartbeat. You have an irregular heartbeat. You have difficulty breathing. Summary The thyroid gland is a small gland located in the lower front part of the neck, just in front of the windpipe (trachea). Hyperthyroidism is when the thyroid gland is too active (overactive) and produces too much of a hormone called thyroxine. The most common cause is Graves' disease, a disorder in which your immune system attacks the thyroid gland. Hyperthyroidism can cause various symptoms, such as unexplained weight loss, nervousness, inability to tolerate heat, or changes in your heartbeat. Treatment may include medicine to reduce the amount of thyroid hormone your body makes, radioiodine therapy, surgery, or medicines to manage symptoms. This information is not intended to replace advice given to you by your health care provider. Make sure you discuss any questions you have with your healthcare provider. Document Revised: 02/01/2020 Document Reviewed: 02/01/2020 Elsevier Patient Education  2022 [employer]vier Inc.  

## 2021-08-28 NOTE — Progress Notes (Signed)
? ? ? 08/28/2021   ? ? ?Endocrinology Consult Note  ? ? ?Subjective:  ? ? Patient ID: Brandon Chase, male    DOB: 06-08-1980, PCP Suzan Slickucker, Alethea Y, MD. ? ? ?Past Medical History:  ?Diagnosis Date  ? Palpitations   ? ? ?Past Surgical History:  ?Procedure Laterality Date  ? CYST REMOVAL TRUNK    ? Also had a cyst removed from right side of cheek  ? Cyst removed  06/01/2004  ? ? ?Social History  ? ?Socioeconomic History  ? Marital status: Married  ?  Spouse name: Not on file  ? Number of children: 2  ? Years of education: Not on file  ? Highest education level: Not on file  ?Occupational History  ? Occupation: Personnel officerlectrician  ?  Comment: [employer]  ?Tobacco Use  ? Smoking status: Never  ? Smokeless tobacco: Never  ?Vaping Use  ? Vaping Use: Never used  ?Substance and Sexual Activity  ? Alcohol use: No  ? Drug use: No  ? Sexual activity: Not on file  ?Other Topics Concern  ? Not on file  ?Social History Narrative  ? Not on file  ? ?Social Determinants of Health  ? ?Financial Resource Strain: Not on file  ?Food Insecurity: Not on file  ?Transportation Needs: Not on file  ?Physical Activity: Not on file  ?Stress: Not on file  ?Social Connections: Not on file  ? ? ?Family History  ?Problem Relation Age of Onset  ? Thyroid disease Mother   ? ? ?Outpatient Encounter Medications as of 08/28/2021  ?Medication Sig  ? clonazePAM (KLONOPIN) 0.5 MG tablet Take by mouth.  ? methimazole (TAPAZOLE) 10 MG tablet Take 5 mg by mouth 3 (three) times daily.  ? [DISCONTINUED] albuterol (VENTOLIN HFA) 108 (90 Base) MCG/ACT inhaler Inhale into the lungs. (Patient not taking: Reported on 08/28/2021)  ? [DISCONTINUED] azelastine (ASTELIN) 0.1 % nasal spray Place 2 sprays into both nostrils 2 (two) times daily. (Patient not taking: Reported on 08/28/2021)  ? [DISCONTINUED] fluticasone (FLONASE) 50 MCG/ACT nasal spray 1 spray by Each Nare route daily. (Patient not taking: Reported on 08/28/2021)  ? [DISCONTINUED] montelukast (SINGULAIR)  10 MG tablet Take 1 tablet (10 mg total) by mouth at bedtime. (Patient not taking: Reported on 08/28/2021)  ? ?No facility-administered encounter medications on file as of 08/28/2021.  ? ? ?ALLERGIES: ?No Known Allergies ? ?VACCINATION STATUS: ? ?There is no immunization history on file for this patient. ? ? ?HPI ? ?Brandon Chase is 41 y.o. male who presents today, accompanied by his wife, with a medical history as above. he is being seen in consultation for hyperthyroidism requested by Suzan Slickucker, Alethea Y, MD.  he has been dealing with symptoms of anxiety, palpitations, tremors, constant headache, and weight loss for several years. These symptoms are progressively worsening and troubling to him within the last several months.  his most recent thyroid labs revealed suppressed TSH of < 0.005 and elevated Free T4 of 2.95, and elevated Free T3 of 8.5 on 08/15/21. ? ?he denies dysphagia, choking, shortness of breath, no recent voice change.  ?  ?he does have family history of thyroid dysfunction in his mother and grandparents (thinks hypothyroidism), but denies family hx of thyroid cancer. he denies personal history of goiter. His PCP did start him on Methimazole 5 mg po TID but he had never taken any medications for his thyroid in the past.  Denies use of Biotin containing supplements.  he is willing to proceed  with appropriate work up and therapy for thyrotoxicosis. ? ?He does note improvement in his symptoms since starting the Methimazole. ? ?Review of systems ? ?Constitutional: +steadily decreasing body weight-says this is intentional, current Body mass index is 28.64 kg/m?., no fatigue, no subjective hyperthermia, no subjective hypothermia, constant headaches ?Eyes: no blurry vision, no xerophthalmia ?ENT: no sore throat, no nodules palpated in throat, no dysphagia/odynophagia, no hoarseness ?Cardiovascular: no chest pain, no shortness of breath, + palpitations, no leg swelling ?Respiratory: no cough, no shortness of  breath ?Gastrointestinal: no nausea/vomiting/diarrhea ?Musculoskeletal: no muscle/joint aches ?Skin: no rashes, no hyperemia ?Neurological: + tremors, no numbness, no tingling, no dizziness ?Psychiatric: no depression, + anxiety ? ? ?Objective:  ?  ?BP 97/62   Pulse 78   Ht 5' 4.3" (1.633 m)   Wt 168 lb 6.4 oz (76.4 kg)   SpO2 99%   BMI 28.64 kg/m?   ?Wt Readings from Last 3 Encounters:  ?08/28/21 168 lb 6.4 oz (76.4 kg)  ?10/04/19 172 lb 12.8 oz (78.4 kg)  ?06/03/12 173 lb 6.4 oz (78.7 kg)  ?  ? ?BP Readings from Last 3 Encounters:  ?08/28/21 97/62  ?10/04/19 116/86  ?05/11/17 104/81  ?         ? ?Physical Exam- Limited ? ?Constitutional:  Body mass index is 28.64 kg/m?. , not in acute distress, normal state of mind ?Eyes:  EOMI, no exophthalmos ?Neck: Supple ?Thyroid: No gross goiter, ? mildly enlarged right side, no palpable nodularity ?Cardiovascular: RRR, no murmurs, rubs, or gallops, no edema ?Respiratory: Adequate breathing efforts, no crackles, rales, rhonchi, or wheezing ?Musculoskeletal: no gross deformities, strength intact in all four extremities, no gross restriction of joint movements ?Skin:  no rashes, no hyperemia ?Neurological: no tremor with outstretched hands ? ? ?CMP  ?   ?Component Value Date/Time  ? NA 136 02/02/2012 1752  ? K 3.5 02/02/2012 1752  ? CL 99 02/02/2012 1752  ? CO2 26 02/02/2012 1752  ? GLUCOSE 94 02/02/2012 1752  ? BUN 11 02/02/2012 1752  ? CREATININE 1.11 02/02/2012 1752  ? CALCIUM 9.9 02/02/2012 1752  ? PROT 7.6 02/02/2012 2253  ? ALBUMIN 4.3 02/02/2012 2253  ? AST 13 02/02/2012 2253  ? ALT 8 02/02/2012 2253  ? ALKPHOS 43 02/02/2012 2253  ? BILITOT 0.5 02/02/2012 2253  ? GFRNONAA 86 (L) 02/02/2012 1752  ? GFRAA >90 02/02/2012 1752  ? ? ? ?CBC ?   ?Component Value Date/Time  ? WBC 5.5 02/02/2012 1752  ? RBC 4.95 02/02/2012 1752  ? HGB 15.1 02/02/2012 1752  ? HCT 43.5 02/02/2012 1752  ? PLT 239 02/02/2012 1752  ? MCV 87.9 02/02/2012 1752  ? MCH 30.5 02/02/2012 1752  ? MCHC  34.7 02/02/2012 1752  ? RDW 12.7 02/02/2012 1752  ? LYMPHSABS 1.7 02/02/2012 1752  ? MONOABS 0.5 02/02/2012 1752  ? EOSABS 0.0 02/02/2012 1752  ? BASOSABS 0.1 02/02/2012 1752  ? ? ? ?Diabetic Labs (most recent): ?No results found for: HGBA1C ? ?Lipid Panel  ?No results found for: CHOL, TRIG, HDL, CHOLHDL, VLDL, LDLCALC, LDLDIRECT, LABVLDL ? ? ?Lab Results  ?Component Value Date  ? TSH 0.01 (A) 08/15/2021  ?  ? ?08/15/21 0000   ?Result status: Final  ?Resulting lab: OTHER  ?Reference range: 0.41 - 5.90  ?Value: 0.01 Abnormal    ?Comment: TSH<0.005; FT4-2.95, FT3 8.5; Thyro AB <1; Thyro Stim AB0.16  ? ? ? ?Assessment & Plan:  ? ?1. Hyperthyroidism-unspecified ? ?he is being seen at a kind  request of Suzan Slick, MD. ? ?his history and most recent labs are reviewed, and he was examined clinically. Subjective and objective findings are consistent with thyrotoxicosis likely from primary hyperthyroidism. The potential risks of untreated thyrotoxicosis and the need for definitive therapy have been discussed in detail with him, and he agrees to proceed with diagnostic workup and treatment plan. ? ?He did have some antibody testing done which were negative, however there is one more antibody I would like to check for to definitively rule out autoimmune thyroid dysfunction, especially given his family history.  ?  ?I will repeat full profile thyroid function tests today to see if he is able to start weaning off Methimazole so that confirmatory thyroid uptake and scan can be scheduled.  Must be off Methimazole for 5 days prior to the test.  ?  ?Options of therapy are discussed with him.  We discussed the option of treating it with medications including methimazole or PTU which may have side effects including rash, transaminitis, and bone marrow suppression.  We also discussed the option of definitive therapy with RAI ablation of the thyroid. If he is found to have primary hyperthyroidism from Graves' disease , toxic  multinodular goiter or toxic nodular goiter the preferred modality of treatment would be I-131 thyroid ablation. Surgery is another choice of treatment in some cases, in his case surgery is not a good fit for pre

## 2021-08-29 LAB — T4, FREE: Free T4: 2.23 ng/dL — ABNORMAL HIGH (ref 0.82–1.77)

## 2021-08-29 LAB — TSH: TSH: <0.005 u[IU]/mL — ABNORMAL LOW (ref 0.450–4.500)

## 2021-08-29 LAB — T3, FREE: T3, Free: 7.2 pg/mL — ABNORMAL HIGH (ref 2.0–4.4)

## 2021-08-29 LAB — THYROID PEROXIDASE ANTIBODY: Thyroperoxidase Ab SerPl-aCnc: 14 IU/mL (ref 0–34)

## 2021-08-29 NOTE — Progress Notes (Signed)
I called patient to discuss repeat thyroid labs.  I am having the patient continue his Methimazole for now and will repeat thyroid function studies in 4 weeks.  Can you reschedule their follow up appointment for about 5 weeks out?

## 2021-08-29 NOTE — Progress Notes (Signed)
Moved appt

## 2021-08-29 NOTE — Addendum Note (Signed)
Addended by: Dani Gobble on: 08/29/2021 08:04 AM ? ? Modules accepted: Orders ? ?

## 2021-09-15 ENCOUNTER — Telehealth: Payer: Self-pay | Admitting: Nurse Practitioner

## 2021-09-15 DIAGNOSIS — E0590 Thyrotoxicosis, unspecified without thyrotoxic crisis or storm: Secondary | ICD-10-CM

## 2021-09-15 NOTE — Telephone Encounter (Signed)
Patient's wife Brandon Chase called and said that his heart rate lately has been high, even with little to no little activity such as walking in the house. He has been feeling dizzy and even more drained ?

## 2021-09-15 NOTE — Telephone Encounter (Signed)
Let's have him go ahead and repeat thyroid labs now and see what is going on.

## 2021-09-15 NOTE — Telephone Encounter (Signed)
Patient's wife made aware, she will call us once completed. ?

## 2021-09-16 LAB — TSH: TSH: <0.005 u[IU]/mL — ABNORMAL LOW (ref 0.450–4.500)

## 2021-09-16 LAB — T3, FREE: T3, Free: 5.4 pg/mL — ABNORMAL HIGH (ref 2.0–4.4)

## 2021-09-16 LAB — T4, FREE: Free T4: 1.77 ng/dL (ref 0.82–1.77)

## 2021-09-16 NOTE — Telephone Encounter (Signed)
Patient completed his labs, can you review & call pt ?

## 2021-09-17 ENCOUNTER — Other Ambulatory Visit: Payer: Self-pay

## 2021-09-17 DIAGNOSIS — E0590 Thyrotoxicosis, unspecified without thyrotoxic crisis or storm: Secondary | ICD-10-CM

## 2021-09-17 MED ORDER — PROPRANOLOL HCL 20 MG PO TABS: 20.0000 mg | ORAL_TABLET | Freq: Two times a day (BID) | ORAL | 1 refills | Status: DC

## 2021-09-17 NOTE — Telephone Encounter (Signed)
Patient called back and I gave him the message. I have sent in the Propranolol 20mg  for the patient to [facility], I have ordered the Free T4, Free T3, and TSH. Patient verbalized an understanding and I have changed his appointment to 10/20/2021.  ?

## 2021-09-17 NOTE — Telephone Encounter (Signed)
Called the patient and left a detailed voice message for the patient to call back to go over lab results. ?

## 2021-09-17 NOTE — Telephone Encounter (Signed)
His labs actually show some improvement in his thyroid function, it is slowing down a bit.  I think we can lower his Methimazole to 5 mg twice daily for now, rechecking labs in 4 weeks to monitor for appropriate timing to continue tapering him off.  His symptoms probably wont change much until we correct the problem.  There are medications we can use to help with the side effects such as Propanolol 20 mg po BID.  This will help slow his HR down.  If they want to proceed with this medication for the side effects, just let me know.

## 2021-09-24 ENCOUNTER — Encounter (HOSPITAL_COMMUNITY): Payer: Self-pay

## 2021-09-24 ENCOUNTER — Emergency Department (HOSPITAL_COMMUNITY): Payer: 59

## 2021-09-24 ENCOUNTER — Other Ambulatory Visit: Payer: Self-pay

## 2021-09-24 ENCOUNTER — Observation Stay (HOSPITAL_COMMUNITY)
Admission: EM | Admit: 2021-09-24 | Discharge: 2021-09-25 | Disposition: A | Discharge status: Home / Self Care | Payer: 59 | Attending: Family Medicine | Admitting: Family Medicine

## 2021-09-24 ENCOUNTER — Observation Stay (HOSPITAL_COMMUNITY): Payer: 59

## 2021-09-24 DIAGNOSIS — Z8616 Personal history of COVID-19: Secondary | ICD-10-CM | POA: Diagnosis not present

## 2021-09-24 DIAGNOSIS — R0602 Shortness of breath: Secondary | ICD-10-CM | POA: Diagnosis not present

## 2021-09-24 DIAGNOSIS — Z79899 Other long term (current) drug therapy: Secondary | ICD-10-CM | POA: Diagnosis not present

## 2021-09-24 DIAGNOSIS — R079 Chest pain, unspecified: Secondary | ICD-10-CM | POA: Diagnosis present

## 2021-09-24 DIAGNOSIS — E0590 Thyrotoxicosis, unspecified without thyrotoxic crisis or storm: Secondary | ICD-10-CM | POA: Insufficient documentation

## 2021-09-24 DIAGNOSIS — R42 Dizziness and giddiness: Secondary | ICD-10-CM | POA: Diagnosis not present

## 2021-09-24 DIAGNOSIS — R0789 Other chest pain: Secondary | ICD-10-CM | POA: Diagnosis not present

## 2021-09-24 DIAGNOSIS — R5383 Other fatigue: Secondary | ICD-10-CM | POA: Insufficient documentation

## 2021-09-24 DIAGNOSIS — Z20822 Contact with and (suspected) exposure to covid-19: Secondary | ICD-10-CM | POA: Diagnosis not present

## 2021-09-24 LAB — HEPATIC FUNCTION PANEL
ALT: 40 U/L (ref 0–44)
AST: 25 U/L (ref 15–41)
Albumin: 3.7 g/dL (ref 3.5–5.0)
Alkaline Phosphatase: 41 U/L (ref 38–126)
Bilirubin, Direct: 0.1 mg/dL (ref 0.0–0.2)
Indirect Bilirubin: 0.7 mg/dL (ref 0.3–0.9)
Total Bilirubin: 0.8 mg/dL (ref 0.3–1.2)
Total Protein: 6.6 g/dL (ref 6.5–8.1)

## 2021-09-24 LAB — CBC
HCT: 44.3 % (ref 39.0–52.0)
Hemoglobin: 14.9 g/dL (ref 13.0–17.0)
MCH: 29.6 pg (ref 26.0–34.0)
MCHC: 33.6 g/dL (ref 30.0–36.0)
MCV: 88.1 fL (ref 80.0–100.0)
Platelets: 276 10*3/uL (ref 150–400)
RBC: 5.03 MIL/uL (ref 4.22–5.81)
RDW: 12.3 % (ref 11.5–15.5)
WBC: 4 10*3/uL (ref 4.0–10.5)
nRBC: 0 % (ref 0.0–0.2)

## 2021-09-24 LAB — URINALYSIS, ROUTINE W REFLEX MICROSCOPIC
Bilirubin Urine: NEGATIVE
Glucose, UA: NEGATIVE mg/dL
Hgb urine dipstick: NEGATIVE
Ketones, ur: NEGATIVE mg/dL
Leukocytes,Ua: NEGATIVE
Nitrite: NEGATIVE
Protein, ur: NEGATIVE mg/dL
Specific Gravity, Urine: 1.017 (ref 1.005–1.030)
pH: 7 (ref 5.0–8.0)

## 2021-09-24 LAB — CBG MONITORING, ED: Glucose-Capillary: 98 mg/dL (ref 70–99)

## 2021-09-24 LAB — RESP PANEL BY RT-PCR (FLU A&B, COVID) ARPGX2
Influenza A by PCR: NEGATIVE
Influenza B by PCR: NEGATIVE
SARS Coronavirus 2 by RT PCR: NEGATIVE

## 2021-09-24 LAB — D-DIMER, QUANTITATIVE: D-Dimer, Quant: <0.27 ug/mL-FEU (ref 0.00–0.50)

## 2021-09-24 LAB — BASIC METABOLIC PANEL
Anion gap: 6 (ref 5–15)
BUN: 10 mg/dL (ref 6–20)
CO2: 27 mmol/L (ref 22–32)
Calcium: 9.4 mg/dL (ref 8.9–10.3)
Chloride: 107 mmol/L (ref 98–111)
Creatinine, Ser: 1.02 mg/dL (ref 0.61–1.24)
GFR, Estimated: >60 mL/min (ref >60)
Glucose, Bld: 99 mg/dL (ref 70–99)
Potassium: 4.4 mmol/L (ref 3.5–5.1)
Sodium: 140 mmol/L (ref 135–145)

## 2021-09-24 LAB — TROPONIN I (HIGH SENSITIVITY)
Troponin I (High Sensitivity): <2 ng/L (ref <18)
Troponin I (High Sensitivity): <2 ng/L (ref <18)

## 2021-09-24 LAB — CK: Total CK: 53 U/L (ref 49–397)

## 2021-09-24 LAB — TSH: TSH: <0.01 u[IU]/mL — ABNORMAL LOW (ref 0.350–4.500)

## 2021-09-24 LAB — MAGNESIUM: Magnesium: 1.8 mg/dL (ref 1.7–2.4)

## 2021-09-24 LAB — PHOSPHORUS: Phosphorus: 3.2 mg/dL (ref 2.5–4.6)

## 2021-09-24 LAB — T4, FREE: Free T4: 1.17 ng/dL — ABNORMAL HIGH (ref 0.61–1.12)

## 2021-09-24 MED ORDER — ACETAMINOPHEN 650 MG RE SUPP: 650.0000 mg | Freq: Four times a day (QID) | RECTAL | Status: DC | PRN

## 2021-09-24 MED ORDER — HYDROCODONE-ACETAMINOPHEN 5-325 MG PO TABS: 1.0000 | ORAL_TABLET | ORAL | Status: DC | PRN

## 2021-09-24 MED ORDER — METHIMAZOLE 5 MG PO TABS
5.0000 mg | ORAL_TABLET | Freq: Three times a day (TID) | ORAL | Status: DC
  Administered 2021-09-24 – 2021-09-25 (×2): 5 mg via ORAL
  Filled 2021-09-24 (×4): qty 1

## 2021-09-24 MED ORDER — ACETAMINOPHEN 325 MG PO TABS: 650.0000 mg | ORAL_TABLET | Freq: Four times a day (QID) | ORAL | Status: DC | PRN

## 2021-09-24 MED ORDER — PROPRANOLOL HCL 20 MG PO TABS
20.0000 mg | ORAL_TABLET | Freq: Two times a day (BID) | ORAL | Status: DC
  Administered 2021-09-24: 20 mg via ORAL
  Filled 2021-09-24 (×3): qty 1

## 2021-09-24 MED ORDER — CLONAZEPAM 0.5 MG PO TABS: 0.2500 mg | ORAL_TABLET | Freq: Every day | ORAL | Status: DC | PRN

## 2021-09-24 MED ORDER — LACTATED RINGERS IV BOLUS
1000.0000 mL | Freq: Once | INTRAVENOUS | Status: AC
  Administered 2021-09-24: 1000 mL via INTRAVENOUS

## 2021-09-24 MED ORDER — CLONAZEPAM 0.25 MG PO TBDP
0.2500 mg | ORAL_TABLET | Freq: Every day | ORAL | Status: DC | PRN
Start: 2021-09-24 — End: 2021-09-25

## 2021-09-24 MED ORDER — CLONAZEPAM 0.125 MG PO TBDP: 0.2500 mg | ORAL_TABLET | Freq: Every day | ORAL | Status: DC | PRN

## 2021-09-24 MED ORDER — ASPIRIN 81 MG PO CHEW
324.0000 mg | CHEWABLE_TABLET | Freq: Once | ORAL | Status: AC
  Administered 2021-09-24: 324 mg via ORAL
  Filled 2021-09-24: qty 4

## 2021-09-24 MED ORDER — SODIUM CHLORIDE 0.9 % IV SOLN
75.0000 mL/h | INTRAVENOUS | Status: AC
  Administered 2021-09-24: 75 mL/h via INTRAVENOUS

## 2021-09-24 NOTE — ED Triage Notes (Signed)
Reports on thyroid medication and feels its getting worse.  Reports he has had more fatigue and chest tightness.  Reports he spread seeds 2 days ago and still feels exhausted.  ?

## 2021-09-24 NOTE — ED Provider Notes (Signed)
?[facility] EMERGENCY DEPARTMENT ?Provider Note ? ? ?CSN: [id number] ?Arrival date & time: 09/24/21  1056 ? ?  ? ?History ? ?Chief Complaint  ?Patient presents with  ? Fatigue  ? ? ?Brandon Chase is a 41 y.o. male. ? ?Patient with a history of hyperthyroidism and methimazole and Inderal presenting with a 2-day history of fatigue, exhaustion, chest pressure and shortness of breath and dizziness.  States symptoms started after he was working in his yard doing landscaping 2 days ago he feels extreme fatigue with lightheadedness and dizziness but not room spinning.  He feels central chest pressure that is fairly constant but waxes and wanes in intensity and associate with shortness of breath.  No nausea or vomiting.  No cough or fever.  No abdominal pain.  States has not felt quite right since having COVID last year.  He was diagnosed with hyperthyroidism several weeks ago and has been compliant with his medications.  He reports they are still being tapered.  Feels like he is having more fatigue and dyspnea with exertion and chest pressure.  He denies any cardiac history.  Reports having a negative stress test several years ago ? ?The history is provided by the patient.  ? ?  ? ?Home Medications ?Prior to Admission medications   ?Medication Sig Start Date End Date Taking? Authorizing Provider  ?clonazePAM (KLONOPIN) 0.5 MG tablet Take by mouth. 09/02/18   [provider]  ?methimazole (TAPAZOLE) 10 MG tablet Take 5 mg by mouth 3 (three) times daily. 08/13/21   [provider]  ?propranolol (INDERAL) 20 MG tablet Take 1 tablet (20 mg total) by mouth 2 (two) times daily. 09/17/21   Dani Gobble, NP  ?   ? ?Allergies    ?Patient has no known allergies.   ? ?Review of Systems   ?Review of Systems  ?Constitutional:  Positive for fatigue. Negative for activity change, appetite change and fever.  ?HENT:  Negative for congestion and rhinorrhea.   ?Eyes:  Negative for visual disturbance.   ?Respiratory:  Positive for chest tightness and shortness of breath.   ?Cardiovascular:  Positive for chest pain.  ?Gastrointestinal:  Negative for abdominal pain, nausea and vomiting.  ?Genitourinary:  Negative for dysuria and hematuria.  ?Musculoskeletal:  Negative for arthralgias, myalgias and neck pain.  ?Skin:  Negative for rash.  ?Neurological:  Positive for dizziness, weakness, light-headedness and headaches.  ? all other systems are negative except as noted in the HPI and PMH.  ? ?Physical Exam ?Updated Vital Signs ?BP 112/67   Pulse 68   Temp 98.1 ?F (36.7 ?C) (Oral)   Resp 17   Ht 5\' 4"  (1.626 m)   Wt 74.8 kg   SpO2 97%   BMI 28.32 kg/m?  ?Physical Exam ?Vitals and nursing note reviewed.  ?Constitutional:   ?   General: He is not in acute distress. ?   Appearance: He is well-developed.  ?HENT:  ?   Head: Normocephalic and atraumatic.  ?   Mouth/Throat:  ?   Pharynx: No oropharyngeal exudate.  ?Eyes:  ?   Conjunctiva/sclera: Conjunctivae normal.  ?   Pupils: Pupils are equal, round, and reactive to light.  ?Neck:  ?   Comments: No meningismus. ?Cardiovascular:  ?   Rate and Rhythm: Normal rate and regular rhythm.  ?   Heart sounds: Normal heart sounds. No murmur heard. ?Pulmonary:  ?   Effort: Pulmonary effort is normal. No respiratory distress.  ?   Breath  sounds: Normal breath sounds.  ?Abdominal:  ?   Palpations: Abdomen is soft.  ?   Tenderness: There is no abdominal tenderness. There is no guarding or rebound.  ?Musculoskeletal:     ?   General: No tenderness. Normal range of motion.  ?   Cervical back: Normal range of motion and neck supple.  ?Skin: ?   General: Skin is warm.  ?   Capillary Refill: Capillary refill takes less than 2 seconds.  ?Neurological:  ?   General: No focal deficit present.  ?   Mental Status: He is alert and oriented to person, place, and time. Mental status is at baseline.  ?   Cranial Nerves: No cranial nerve deficit.  ?   Motor: No abnormal muscle tone.  ?    Coordination: Coordination normal.  ?   Comments:  5/5 strength throughout. CN 2-12 intact.Equal grip strength.   ?Psychiatric:     ?   Behavior: Behavior normal.  ? ? ?ED Results / Procedures / Treatments   ?Labs ?(all labs ordered are listed, but only abnormal results are displayed) ?Labs Reviewed  ?TSH - Abnormal; Notable for the following components:  ?    Result Value  ? TSH <0.010 (*)   ? All other components within normal limits  ?T4, FREE - Abnormal; Notable for the following components:  ? Free T4 1.17 (*)   ? All other components within normal limits  ?RESP PANEL BY RT-PCR (FLU A&B, COVID) [id number]  ?BASIC METABOLIC PANEL  ?CBC  ?URINALYSIS, ROUTINE W REFLEX MICROSCOPIC  ?D-DIMER, QUANTITATIVE  ?T3  ?URINALYSIS, COMPLETE (UACMP) WITH MICROSCOPIC  ?CK  ?MAGNESIUM  ?PHOSPHORUS  ?PREALBUMIN  ?RAPID URINE DRUG SCREEN, HOSP PERFORMED  ?HEPATIC FUNCTION PANEL  ?CBG MONITORING, ED  ?TROPONIN I (HIGH SENSITIVITY)  ?TROPONIN I (HIGH SENSITIVITY)  ? ? ?EKG ?EKG Interpretation ? ?Date/Time:  Wednesday September 24 2021 11:59:07 EDT ?Ventricular Rate:  105 ?PR Interval:  158 ?QRS Duration: 74 ?QT Interval:  348 ?QTC Calculation: 459 ?R Axis:   78 ?Text Interpretation: Sinus rhythm with frequent Premature ventricular complexes Right atrial enlargement Low voltage QRS Cannot rule out Anterior infarct , age undetermined Abnormal ECG When compared with ECG of 11-May-2017 09:09, PREVIOUS ECG IS PRESENT No significant change was found Confirmed by Glynn Octaveancour, Antonin Meininger [phone]([id number]) on 09/24/2021 3:22:17 PM ? ?Radiology ?DG Chest 2 View ? ?Result Date: 09/24/2021 ?CLINICAL DATA:  Chest pain fatigue EXAM: CHEST - 2 VIEW COMPARISON:  05/11/2017 FINDINGS: The heart size and mediastinal contours are within normal limits. Both lungs are clear. The visualized skeletal structures are unremarkable. IMPRESSION: No active cardiopulmonary disease. Electronically Signed   By: Judie PetitM.  Shick M.D.   On: 09/24/2021 16:47   ? ?Procedures ?Procedures   ? ? ?Medications Ordered in ED ?Medications - No data to display ? ?ED Course/ Medical Decision Making/ A&P ?  ?                        ?Medical Decision Making ?Amount and/or Complexity of Data Reviewed ?Labs: ordered. ?Radiology: ordered. ?ECG/medicine tests: ordered. ? ?Risk ?OTC drugs. ?Decision regarding hospitalization. ? ?Patient with hypothyroidism here with 2 days of extreme fatigue, dyspnea on exertion, chest tightness lightheadedness and dizziness.  Nonfocal neurological exam.  EKG is nonischemic with no ST elevations ? ?Consider ACS.  Heart score is 2 but history is concerning for exertional chest pain with shortness of breath.  EKG without acute ischemia.  Troponin is negative, chest  x-ray is negative results reviewed and interpreted by me, D-dimer is negative with low suspicion for PE or aortic dissection. ? ?TSH is undetectable.  Free T4 is high.  Patient currently being treated for hyperthyroidism by his PCP.  No evidence of thyroid storm. ? ?Unclear etiology of patient's symptoms but his story of exertional chest pain or shortness of breath with dizziness is concerning.  Would benefit from likely stress testing and further evaluation. ? ?We will hydrate, check orthostatics.  Admission discussed with Dr. Adela Glimpse ? ? ? ? ? ? ? ?Final Clinical Impression(s) / ED Diagnoses ?Final diagnoses:  ?Exertional chest pain  ?Other fatigue  ?Hyperthyroidism  ? ? ?Rx / DC Orders ?ED Discharge Orders   ? ? None  ? ?  ? ? ?  ?Glynn Octave, MD ?09/24/21 2023 ? ?

## 2021-09-24 NOTE — Subjective & Objective (Signed)
Chest pain on exertion, fatigue, dyspnea on exertion, Cp with exertion ?Recently diagnosed with hyperthyroidism on Methymazole  ?

## 2021-09-24 NOTE — H&P (Signed)
? ? ?Brandon Chase [id number] DOB: [birth year] DOA: 09/24/2021 ? ? ?  ?PCP: Leeanne Rio, MD   ?Outpatient Specialists: Endocrinology in [place] ?Patient arrived to ER on 09/24/21 at 1056 ?Referred by Attending Ezequiel Essex, MD ? ? ?Patient coming from:   ? home Lives With family ? ? ?Chief Complaint: ?Chief Complaint  ?Patient presents with  ? Fatigue  ? ? ?HPI: ?Brandon Chase is a 41 y.o. male with medical history significant of hyperthyroidism ?  ?  ? ?Presented with    fatigue and chest pain with exertion ?Chest pain on exertion, fatigue, dyspnea on exertion, Cp with exertion ?Recently diagnosed with hyperthyroidism on Methymazole  ?Patient has known history of hyperthyroidism on methimazole.  On 19 April patient was started on propranolol few days later he has been having worsening fatigue and exertional chest pressure he has constant sensation of some sort of discomfort in his chest otherwise.  He does not smoke or drink no family history of early heart disease or any heart disease at all. ?Prior no history of CAD.  No underlying history of hypertension or hyperlipidemia ?Patient is followed by endocrinology in [place].  Plan was to decrease his methimazole so he can undergo iodine uptake thyroid study.  But appears that once his medication was changed his symptoms have became worse. ?Given exertional nature of his chest discomfort ER provider asked for further evaluation ? Initial COVID TEST  ?NEGATIVE ? ?Lab Results  ?Component Value Date  ? [place] NEGATIVE 09/24/2021  ? ?  ?Regarding pertinent Chronic problems: ? ?Hyperthyroidism:  ?Lab Results  ?Component Value Date  ? TSH <0.010 (L) 09/24/2021  ? on methimazole and propranolol ? ? ?While in ER: ?  ?Noted nondetectable TSH and somewhat elevated T free T4 no evidence of tachycardia troponin within normal limits D-dimer within normal limits chest x-ray nonacute EKG non acute ischemia  ?But still abnormal ?  ? ?CXR - NON  acute ? ? ?Following Medications were ordered in ER: ?Medications  ?aspirin chewable tablet 324 mg (324 mg Oral Given 09/24/21 1811)  ?   ?ED Triage Vitals  ?Enc Vitals Group  ?   BP 09/24/21 1120 113/70  ?   Pulse Rate 09/24/21 1120 82  ?   Resp 09/24/21 1120 18  ?   Temp 09/24/21 1120 98.1 ?F (36.7 ?C)  ?   Temp Source 09/24/21 1120 Oral  ?   SpO2 09/24/21 1120 98 %  ?   Weight 09/24/21 1130 165 lb (74.8 kg)  ?   Height 09/24/21 1130 5\' 4"  (1.626 m)  ?   Head Circumference --   ?   Peak Flow --   ?   Pain Score 09/24/21 1130 1  ?   Pain Loc --   ?   Pain Edu? --   ?   Excl. in [place]? --   ?[id number]    ? _________________________________________ ?Significant initial  Findings: ?Abnormal Labs Reviewed  ?TSH - Abnormal; Notable for the following components:  ?    Result Value  ? TSH <0.010 (*)   ? All other components within normal limits  ?T4, FREE - Abnormal; Notable for the following components:  ? Free T4 1.17 (*)   ? All other components within normal limits  ? ?  ?_________________________ ?Troponin <2 <2 ?ECG: Ordered ?Personally reviewed by me showing: ?HR : 105 ?Rhythm: Sinus rhythm with frequent Premature ventricular complexes ?Right atrial enlargement ?Low voltage QRS ?Cannot rule out Anterior infarct ,  age undetermined ?Abnormal ECG ?QTC 459 ?  ?The recent clinical data is shown below. ?Vitals:  ? 09/24/21 1120 09/24/21 1130 09/24/21 1354 09/24/21 1716  ?BP: 113/70  112/67 (!) 117/91  ?Pulse: 82  68 74  ?Resp: 18  17 19   ?Temp: 98.1 ?F (36.7 ?C)     ?TempSrc: Oral     ?SpO2: 98%  97% 100%  ?Weight:  74.8 kg    ?Height:  5\' 4"  (1.626 m)    ?  ?WBC ? ?   ?Component Value Date/Time  ? WBC 4.0 09/24/2021 1157  ? LYMPHSABS 1.7 02/02/2012 1752  ? MONOABS 0.5 02/02/2012 1752  ? EOSABS 0.0 02/02/2012 1752  ? BASOSABS 0.1 02/02/2012 1752  ? ?  ?Lactic Acid, Venous ?No results found for: LATICACIDVEN ?  ? ? UA   no evidence of UTI   ?Urine analysis: ?   ?Component Value Date/Time  ? COLORURINE YELLOW 09/24/2021 1224   ? APPEARANCEUR CLEAR 09/24/2021 1224  ? LABSPEC 1.017 09/24/2021 1224  ? PHURINE 7.0 09/24/2021 1224  ? GLUCOSEU NEGATIVE 09/24/2021 1224  ? HGBUR NEGATIVE 09/24/2021 1224  ? BILIRUBINUR NEGATIVE 09/24/2021 1224  ? KETONESUR NEGATIVE 09/24/2021 1224  ? PROTEINUR NEGATIVE 09/24/2021 1224  ? NITRITE NEGATIVE 09/24/2021 1224  ? LEUKOCYTESUR NEGATIVE 09/24/2021 1224  ? ? ?Results for orders placed or performed during the hospital encounter of 09/24/21  ?Resp Panel by RT-PCR (Flu A&B, Covid) Nasopharyngeal Swab     Status: None  ? Collection Time: 09/24/21  3:44 PM  ? Specimen: Nasopharyngeal Swab; Nasopharyngeal(NP) swabs in vial transport medium  ?Result Value Ref Range Status  ? SARS Coronavirus 2 by RT PCR NEGATIVE NEGATIVE Final  ?      ? Influenza A by PCR NEGATIVE NEGATIVE Final  ? Influenza B by PCR NEGATIVE NEGATIVE Final  ?      ? ? ? ?_______________________________________________ ?Hospitalist was called for admission for chest pain  ?   ?The following Work up has been ordered so far: ? ?Orders Placed This Encounter  ?Procedures  ? Resp Panel by RT-PCR (Flu A&B, Covid) Nasopharyngeal Swab  ? DG Chest 2 View  ? Basic metabolic panel  ? CBC  ? Urinalysis, Routine w reflex microscopic  ? TSH  ? D-dimer, quantitative  ? T4, free  ? T3  ? Document Height and Actual Weight  ? Consult to hospitalist  ? Airborne and Contact precautions  ? CBG monitoring, ED  ? ED EKG  ? EKG 12-Lead  ?  ? ?OTHER Significant initial  Findings: ? ?labs showing: ? ?  ?Recent Labs  ?Lab 09/24/21 ?1157  ?NA 140  ?K 4.4  ?CO2 27  ?GLUCOSE 99  ?BUN 10  ?CREATININE 1.02  ?CALCIUM 9.4  ? ? ?Cr   stable,    ?Lab Results  ?Component Value Date  ? CREATININE 1.02 09/24/2021  ? CREATININE 1.11 02/02/2012  ? ? ?No results for input(s): AST, ALT, ALKPHOS, BILITOT, PROT, ALBUMIN in the last 168 hours. ?Lab Results  ?Component Value Date  ? CALCIUM 9.4 09/24/2021  ? ?    ?   ?Plt: ?Lab Results  ?Component Value Date  ? PLT 276 09/24/2021  ?   ?COVID-19 Labs ? ?Recent Labs  ?  09/24/21 ?1706  ?DDIMER <0.27  ? ? ?Lab Results  ?Component Value Date  ? SARSCOV2NAA NEGATIVE 09/24/2021  ?  ?   ?Recent Labs  ?Lab 09/24/21 ?1157  ?WBC 4.0  ?HGB 14.9  ?HCT 44.3  ?  MCV 88.1  ?PLT 276  ? ? ?HG/HCT  stable,    ?   ?Component Value Date/Time  ? HGB 14.9 09/24/2021 1157  ? HCT 44.3 09/24/2021 1157  ? MCV 88.1 09/24/2021 1157  ? ?   ?Cardiac Panel (last 3 results) ?No results for input(s): CKTOTAL, CKMB, TROPONINI, RELINDX in the last 72 hours. ? .car ?BNP (last 3 results) ?No results for input(s): BNP in the last 8760 hours. ?   ? ?    ?Cultures: ?No results found for: SDES, [place], [place], REPTSTATUS ?  ?Radiological Exams on Admission: ?DG Chest 2 View ? ?Result Date: 09/24/2021 ?CLINICAL DATA:  Chest pain fatigue EXAM: CHEST - 2 VIEW COMPARISON:  05/11/2017 FINDINGS: The heart size and mediastinal contours are within normal limits. Both lungs are clear. The visualized skeletal structures are unremarkable. IMPRESSION: No active cardiopulmonary disease. Electronically Signed   By: Jerilynn Mages.  Shick M.D.   On: 09/24/2021 16:47   ?_______________________________________________________________________________________________________ ?Latest   ?Blood pressure (!) 117/91, pulse 74, temperature 98.1 ?F (36.7 ?C), temperature source Oral, resp. rate 19, height 5\' 4"  (1.626 m), weight 74.8 kg, SpO2 100 %. ? ? Vitals  labs and radiology finding personally reviewed  ?Review of Systems:  ? ? Pertinent positives include:   dyspnea on exertion,Chest pain  ? ?Constitutional:  ?No weight loss, night sweats, Fevers, chills, fatigue, weight loss  ?HEENT:  ?No headaches, Difficulty swallowing,Tooth/dental problems,Sore throat,  ?No sneezing, itching, ear ache, nasal congestion, post nasal drip,  ?Cardio-vascular:  ?No chest pain, Orthopnea, PND, anasarca, dizziness, palpitations.no Bilateral lower extremity swelling  ?GI:  ?No heartburn, indigestion, abdominal pain, nausea, vomiting,  diarrhea, change in bowel habits, loss of appetite, melena, blood in stool, hematemesis ?Resp:  ?no shortness of breath at rest. No No excess mucus, no productive cough, No non-productive cough, No coughing up of blood.No change

## 2021-09-24 NOTE — ED Provider Triage Note (Signed)
Emergency Medicine Provider Triage Evaluation Note ? ?Brandon Chase , a 41 y.o. male  was evaluated in triage.  Pt complains of fatigue and chest pain.  Pt is hyperthyroid  ? ?Review of Systems  ?Positive: Weakness, fatigue  ?Negative: No fever ? ?Physical Exam  ?BP 113/70   Pulse 82   Temp 98.1 ?F (36.7 ?C) (Oral)   Resp 18   Ht 5\' 4"  (1.626 m)   Wt 74.8 kg   SpO2 98%   BMI 28.32 kg/m?  ?Gen:   Awake, no distress   ?Resp:  Normal effort  ?MSK:   Moves extremities without difficulty  ?Other:   ? ?Medical Decision Making  ?Medically screening exam initiated at 11:42 AM.  Appropriate orders placed.  was informed that the remainder of the evaluation will be completed by another provider, this initial triage assessment does not replace that evaluation, and the importance of remaining in the ED until their evaluation is complete. ? ? ?  ?Brandon Chase, [name] ?09/24/21 1148 ? ?

## 2021-09-24 NOTE — Telephone Encounter (Signed)
Called the patient and went over the message with him and he did state that he has a pressure on his chest and an uncomfortable feeling. He did state that he is taking the Propranolol and this did help with his irregular heart beat and elevated pulse. I advised for the patient to go to the ER to be thoroughly examined. Patient verbalized an understanding and will go. ?

## 2021-09-24 NOTE — Addendum Note (Signed)
Addended by: Dani Gobble on: 09/24/2021 09:01 AM ? ? Modules accepted: Orders ? ?

## 2021-09-24 NOTE — Assessment & Plan Note (Signed)
Methimazole was recently decreased to 2 times a day and since then patient started to have more symptoms.  Also was recently started on propranolol which could have contributed to his fatigue bump methimazole back to 3 times a day and continue to monitor rehydrate obtain echogram patient will need to have followed to follow-up with his endocrinologist at this point no evidence of thyrotoxicosis ?

## 2021-09-24 NOTE — ED Notes (Signed)
Patient transported to X-ray 

## 2021-09-24 NOTE — Assessment & Plan Note (Signed)
Persistent CP in the setting of hyperthyroidism ?Recently been started on propranolol. ?Given exertional chest pain obtain echogram may benefit from father follow-up with cardiology if echogram abnormal may need further work-up just in case keep n.p.o. postmidnight in case needs a stress test at this point troponin unremarkable EKG nonischemic ? ?

## 2021-09-24 NOTE — Telephone Encounter (Signed)
Patient said he is still extremely tired. He said he only did one hour of yard work 2 days and this morning he still not feeling the best. He tried to jump in the trampoline with his kids yesterday and saw stars. He said he feels uneasy in his chest. Patient is concerned. Please Advise. [phone] ?

## 2021-09-24 NOTE — ED Notes (Signed)
Pharmacist asked to have tech complete med rec  ?

## 2021-09-24 NOTE — ED Notes (Signed)
Admitting at bedside 

## 2021-09-24 NOTE — Telephone Encounter (Signed)
We only did labs just over a week ago but given his symptoms, I think it best to repeat.  If he has chest pain, he needs to go to the ED to get checked out.  Ensure he is taking the Propanolol we sent in for him.  Hyperthyroidism can cause thyroid storm which is a medical emergency.

## 2021-09-25 ENCOUNTER — Encounter: Payer: Self-pay | Admitting: Family Medicine

## 2021-09-25 ENCOUNTER — Ambulatory Visit: Payer: BLUE CROSS/BLUE SHIELD | Admitting: Nurse Practitioner

## 2021-09-25 ENCOUNTER — Observation Stay (HOSPITAL_BASED_OUTPATIENT_CLINIC_OR_DEPARTMENT_OTHER): Payer: 59

## 2021-09-25 ENCOUNTER — Telehealth: Payer: Self-pay | Admitting: "Endocrinology

## 2021-09-25 ENCOUNTER — Observation Stay (HOSPITAL_COMMUNITY): Payer: 59

## 2021-09-25 DIAGNOSIS — R079 Chest pain, unspecified: Secondary | ICD-10-CM

## 2021-09-25 DIAGNOSIS — E0590 Thyrotoxicosis, unspecified without thyrotoxic crisis or storm: Secondary | ICD-10-CM | POA: Diagnosis not present

## 2021-09-25 LAB — COMPREHENSIVE METABOLIC PANEL
ALT: 31 U/L (ref 0–44)
AST: 20 U/L (ref 15–41)
Albumin: 3.1 g/dL — ABNORMAL LOW (ref 3.5–5.0)
Alkaline Phosphatase: 41 U/L (ref 38–126)
Anion gap: 5 (ref 5–15)
BUN: 11 mg/dL (ref 6–20)
CO2: 26 mmol/L (ref 22–32)
Calcium: 8.7 mg/dL — ABNORMAL LOW (ref 8.9–10.3)
Chloride: 109 mmol/L (ref 98–111)
Creatinine, Ser: 0.94 mg/dL (ref 0.61–1.24)
GFR, Estimated: >60 mL/min (ref >60)
Glucose, Bld: 107 mg/dL — ABNORMAL HIGH (ref 70–99)
Potassium: 3.6 mmol/L (ref 3.5–5.1)
Sodium: 140 mmol/L (ref 135–145)
Total Bilirubin: 0.4 mg/dL (ref 0.3–1.2)
Total Protein: 5.4 g/dL — ABNORMAL LOW (ref 6.5–8.1)

## 2021-09-25 LAB — NM MYOCAR MULTI W/SPECT W/WALL MOTION / EF
Angina Index: 0
Duke Treadmill Score: 9
Estimated workload: 10.1
Exercise duration (min): 9 min
MPHR: 177 {beats}/min
Peak HR: 164 {beats}/min
Percent HR: 92 %
Rest HR: 85 {beats}/min
ST Depression (mm): 0 mm

## 2021-09-25 LAB — ECHOCARDIOGRAM COMPLETE
Area-P 1/2: 3.99 cm2
Calc EF: 62.7 %
Height: 64 in
S' Lateral: 3.2 cm
Single Plane A2C EF: 61.3 %
Single Plane A4C EF: 63.8 %
Weight: 2814.83 oz

## 2021-09-25 LAB — CBC
HCT: 39.7 % (ref 39.0–52.0)
Hemoglobin: 13.5 g/dL (ref 13.0–17.0)
MCH: 29.5 pg (ref 26.0–34.0)
MCHC: 34 g/dL (ref 30.0–36.0)
MCV: 86.7 fL (ref 80.0–100.0)
Platelets: 234 10*3/uL (ref 150–400)
RBC: 4.58 MIL/uL (ref 4.22–5.81)
RDW: 12.3 % (ref 11.5–15.5)
WBC: 4.4 10*3/uL (ref 4.0–10.5)
nRBC: 0 % (ref 0.0–0.2)

## 2021-09-25 LAB — HIV ANTIBODY (ROUTINE TESTING W REFLEX): HIV Screen 4th Generation wRfx: NONREACTIVE

## 2021-09-25 LAB — T3: T3, Total: 148 ng/dL (ref 71–180)

## 2021-09-25 LAB — PREALBUMIN: Prealbumin: 22.6 mg/dL (ref 18–38)

## 2021-09-25 MED ORDER — ACETAMINOPHEN 325 MG PO TABS: 650.0000 mg | ORAL_TABLET | Freq: Four times a day (QID) | ORAL | Status: DC | PRN

## 2021-09-25 MED ORDER — PROPRANOLOL HCL 10 MG PO TABS: 20.0000 mg | ORAL_TABLET | Freq: Two times a day (BID) | ORAL | Status: DC

## 2021-09-25 MED ORDER — REGADENOSON 0.4 MG/5ML IV SOLN
INTRAVENOUS | Status: DC
Start: 2021-09-25 — End: 2021-09-25
  Filled 2021-09-25: qty 5

## 2021-09-25 MED ORDER — TECHNETIUM TC 99M TETROFOSMIN IV KIT
30.3000 | PACK | Freq: Once | INTRAVENOUS | Status: AC | PRN
  Administered 2021-09-25: 30.3 via INTRAVENOUS

## 2021-09-25 MED ORDER — METHIMAZOLE 5 MG PO TABS: 5.0000 mg | ORAL_TABLET | Freq: Three times a day (TID) | ORAL | 1 refills | Status: DC

## 2021-09-25 MED ORDER — REGADENOSON 0.4 MG/5ML IV SOLN
0.4000 mg | Freq: Once | INTRAVENOUS | Status: DC
Start: 2021-09-25 — End: 2021-09-25
  Filled 2021-09-25: qty 5

## 2021-09-25 MED ORDER — TECHNETIUM TC 99M TETROFOSMIN IV KIT
10.9000 | PACK | Freq: Once | INTRAVENOUS | Status: AC | PRN
  Administered 2021-09-25: 10.9 via INTRAVENOUS

## 2021-09-25 MED ORDER — PROPRANOLOL HCL 10 MG PO TABS
10.0000 mg | ORAL_TABLET | Freq: Two times a day (BID) | ORAL | Status: DC
Start: 2021-09-25 — End: 2021-09-25
  Filled 2021-09-25: qty 1

## 2021-09-25 NOTE — Telephone Encounter (Signed)
Dr Fransico Him ?This is just an FYI- This patient is new to [place] but if you read below the issues that he has been having, he was referred to go to the Emergency Room by [facility] yesterday and he did and he is being discharged today. The provider at the hospital suggested that he needs to see you in regards to the same issues and he mentioned adrenal. He is coming on monday ?

## 2021-09-25 NOTE — Plan of Care (Signed)

## 2021-09-25 NOTE — Progress Notes (Deleted)
{  Select_TRH_Note:[id number]} 

## 2021-09-25 NOTE — Progress Notes (Signed)
?  Echocardiogram ?2D Echocardiogram has been performed. ? ?Augustine Radar ?09/25/2021, 9:03 AM ?

## 2021-09-25 NOTE — Progress Notes (Signed)
? ?  Brandon Chase presented for a exercise cardiolite today.  No immediate complications.  Stress imaging is pending at this time. ? ?Laverda Page, NP ?09/25/2021, 11:54 AM  ? ?

## 2021-09-25 NOTE — Consult Note (Signed)
CARDIOLOGY CONSULT NOTE  ? ? ? ? ? ?Patient ID: ?Brandon Chase ?MRN: [id number] ?DOB/AGE: 1980-10-14 41 y.o. ? ?Admit date: 09/24/2021 ?Referring Physician: Adela Glimpse ?Primary Physician: Suzan Slick, MD ?Primary Cardiologist: New ?Reason for Consultation: Chest Pain ? ?Principal Problem: ?  Chest pain ?Active Problems: ?  Hyperthyroidism ? ? ?HPI:  41 y.o. referred by Dr Adela Glimpse for chest pain. Patient is actively being Rx for hyperthyroidism He is on Methimazole He has had worsening fatigue On my interview he really has not had much chest pain. Describes brief episodes of tightness less than a minute not always exertional. Seems to have a lot of brain fog CXR NAD, troponin negative ECG normal D dimer negative T4 elevated 1.17  ?TSH suppressed < .01 CT head with no acute abnormalities He is on inderal along with his Tapazole and no evidence of atrial arrhythmias  ? ?ROS ?All other systems reviewed and negative except as noted above ? ?Past Medical History:  ?Diagnosis Date  ? Palpitations   ?  ?Family History  ?Problem Relation Age of Onset  ? Thyroid disease Mother   ?  ?Social History  ? ?Socioeconomic History  ? Marital status: Married  ?  Spouse name: Not on file  ? Number of children: 2  ? Years of education: Not on file  ? Highest education level: Not on file  ?Occupational History  ? Occupation: Personnel officer  ?  Comment: Company secretary  ?Tobacco Use  ? Smoking status: Never  ? Smokeless tobacco: Never  ?Vaping Use  ? Vaping Use: Never used  ?Substance and Sexual Activity  ? Alcohol use: No  ? Drug use: No  ? Sexual activity: Not on file  ?Other Topics Concern  ? Not on file  ?Social History Narrative  ? Not on file  ? ?Social Determinants of Health  ? ?Financial Resource Strain: Not on file  ?Food Insecurity: Not on file  ?Transportation Needs: Not on file  ?Physical Activity: Not on file  ?Stress: Not on file  ?Social Connections: Not on file  ?Intimate Partner Violence: Not on file  ?  ?Past Surgical  History:  ?Procedure Laterality Date  ? CYST REMOVAL TRUNK    ? Also had a cyst removed from right side of cheek  ? Cyst removed  06/01/2004  ?  ? ? ?Current Facility-Administered Medications:  ?  acetaminophen (TYLENOL) tablet 650 mg, 650 mg, Oral, Q6H PRN **OR** acetaminophen (TYLENOL) suppository 650 mg, 650 mg, Rectal, Q6H PRN, Doutova, Anastassia, MD ?  clonazePAM (KLONOPIN) disintegrating tablet 0.25 mg, 0.25 mg, Oral, Daily PRN, Doutova, Anastassia, MD ?  HYDROcodone-acetaminophen (NORCO/VICODIN) 5-325 MG per tablet 1-2 tablet, 1-2 tablet, Oral, Q4H PRN, Doutova, Anastassia, MD ?  methimazole (TAPAZOLE) tablet 5 mg, 5 mg, Oral, TID, Doutova, Anastassia, MD, 5 mg at 09/25/21 0840 ?  propranolol (INDERAL) tablet 20 mg, 20 mg, Oral, BID, Doutova, Anastassia, MD, 20 mg at 09/24/21 2147 ? methimazole  5 mg Oral TID  ? propranolol  20 mg Oral BID  ? ? ? ?Physical Exam: ?Blood pressure 94/62, pulse 77, temperature 97.9 ?F (36.6 ?C), temperature source Oral, resp. rate 18, height 5\' 4"  (1.626 m), weight 79.8 kg, SpO2 97 %.   ? ?Affect appropriate ?Healthy:  appears stated age ?HEENT: normal ?Neck supple with no adenopathy ?JVP normal no bruits no thyromegaly ?Lungs clear with no wheezing and good diaphragmatic motion ?Heart:  S1/S2 no murmur, no rub, gallop or click ?PMI normal ?Abdomen: benighn, BS positve, no  tenderness, no AAA ?no bruit.  No HSM or HJR ?Distal pulses intact with no bruits ?No edema ?Neuro non-focal ?Skin warm and dry ?No muscular weakness ? ? ?Labs: ?  ?Lab Results  ?Component Value Date  ? WBC 4.4 09/25/2021  ? HGB 13.5 09/25/2021  ? HCT 39.7 09/25/2021  ? MCV 86.7 09/25/2021  ? PLT 234 09/25/2021  ?  ?Recent Labs  ?Lab 09/25/21 ?0332  ?NA 140  ?K 3.6  ?CL 109  ?CO2 26  ?BUN 11  ?CREATININE 0.94  ?CALCIUM 8.7*  ?PROT 5.4*  ?BILITOT 0.4  ?ALKPHOS 41  ?ALT 31  ?AST 20  ?GLUCOSE 107*  ? ?Lab Results  ?Component Value Date  ? CKTOTAL 53 09/24/2021  ? No results found for: CHOL ?No results found for:  HDL ?No results found for: LDLCALC ?No results found for: TRIG ?No results found for: CHOLHDL ?No results found for: LDLDIRECT  ?  ?Radiology: ?DG Chest 2 View ? ?Result Date: 09/24/2021 ?CLINICAL DATA:  Chest pain fatigue EXAM: CHEST - 2 VIEW COMPARISON:  05/11/2017 FINDINGS: The heart size and mediastinal contours are within normal limits. Both lungs are clear. The visualized skeletal structures are unremarkable. IMPRESSION: No active cardiopulmonary disease. Electronically Signed   By: Judie Petit.  Shick M.D.   On: 09/24/2021 16:47  ? ?CT Head Wo Contrast ? ?Result Date: 09/24/2021 ?CLINICAL DATA:  Dizziness.  Fatigue, exhaustion and chest pressure EXAM: CT HEAD WITHOUT CONTRAST TECHNIQUE: Contiguous axial images were obtained from the base of the skull through the vertex without intravenous contrast. RADIATION DOSE REDUCTION: This exam was performed according to the departmental dose-optimization program which includes automated exposure control, adjustment of the mA and/or kV according to patient size and/or use of iterative reconstruction technique. COMPARISON:  None. FINDINGS: Brain: No evidence of acute infarction, hemorrhage, hydrocephalus, extra-axial collection or mass lesion/mass effect. Vascular: No hyperdense vessel or unexpected calcification. Skull: Normal. Negative for fracture or focal lesion. Sinuses/Orbits: No acute finding. Other: None IMPRESSION: 1. No acute intracranial abnormalities.  Normal brain. Electronically Signed   By: Signa Kell M.D.   On: 09/24/2021 21:00   ? ?EKG: NSR normal  ? ? ?ASSESSMENT AND PLAN:  ? ?Chest Pain:  atypical r/o normal ECG Need to avoid iodinated contrast agents with active hyperthyroidism on Tapazole Shared decision making have ordered exercise myovue study for today  ?Thyroid:  T4 still up and TSH suppressed Follows with endocrine in [place] Continue Tapazole and inderal  ? ?Suspect he can be d/c latter today if myovue normal  ? ?Signed: ?Charlton Haws ?09/25/2021,  9:11 AM ? ? ?

## 2021-09-25 NOTE — Discharge Summary (Signed)
Physician Discharge Summary  ?Brandon Chase ZOX:[id number] DOB: 04-19-1981 DOA: 09/24/2021 ? ?PCP: Suzan Slickucker, Alethea Y, MD ? ?Admit date: 09/24/2021 ?Discharge date: 09/25/2021 ? ?Time spent: 46 minutes ? ?Recommendations for Outpatient Follow-up:  ?Suggest outpatient consideration for work-up of fatigue and lethargy-consider getting ACTH stim testing in the outpatient setting under the care of Dr. Fransico HimNida who I will CC on this note ?Note medication changes: Back down to propranolol 10 twice daily, methimazole increased to 3 times daily ? ?Discharge Diagnoses:  ?MAIN problem for hospitalization  ? ?Chest pain rule out ?Underlying fatigue cause unclear at time of discharge ? ?Please see below for itemized issues addressed in HOpsital- ?refer to other progress notes for clarity if needed ? ?Discharge Condition: Improved ? ?Diet recommendation: Heart healthy ? ?Filed Weights  ? 09/24/21 1130 09/24/21 2300  ?Weight: 74.8 kg 79.8 kg  ? ? 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 ?[facility] with fatigue chest pain and constant sensation of chest discomfort ? ?He was ruled out by cardiac enzymes but because of the history he underwent Lexiscan stress testing and this was found to be negative ? ?He tells me in further history that he has not really felt "normal" in the past 5 years-when asked him to clarify he tells me that he is felt weak and "swimmy headed" for about a year and a half-his wife admits that he has gained some central weight and although he  does not have any other pathognomic signs at this time he is somewhat hypotensive ? ?I have some suspicion that he may have some type of addisonian disease-I suspect that he will need work-up with Dr. Fransico HimNida of endocrinology and have explained this to the patient in detail ? ?Patient understands all of the above-I have given him a work note excusing him from duty so that he can get the follow-up that is required ? ?Cardiology cleared him for discharge and he will go home later today after meeting maximal hospital benefit ? ? ?Discharge Exam: ?Vitals:  ? 09/25/21 1143 09/25/21 1144  ?BP: (!) 120/55 120/68  ?Pulse:    ?Resp:    ?Temp:    ?SpO2:    ? ? ?Subj on day of d/c ?  ?No chest pain at this time awake coherent in no distress ? ?General Exam on discharge ? ?No icterus no pallor moderate dentition ?Slightly enlarged thyroid no specific nodule listed ?Chest is clear no rales rhonchi wheeze ?S1-S2 seems to be in sinus rhythm on exam ?Abdomen is soft no rebound no guarding ?No lower extremity pretibial edema ?No exophthalmos ? ?Discharge Instructions ? ? ?Discharge Instructions   ? ? Diet - low sodium heart healthy   Complete by: As directed ?  ? Discharge instructions   Complete by: As directed ?  ? Please see changes to your mediciaitons and use them as directed ?Please go to Dr. Fransico HimNida wihtin a week to get close follow up--you maty require testing for adrenal insufficiency  ? Increase activity slowly   Complete by: As directed ?  ? ?  ? ?Allergies  as of 09/25/2021   ?No Known Allergies ?  ? ?  ?Medication List  ?  ? ?TAKE these medications   ? ?acetaminophen 325 MG tablet ?Commonly known as: TYLENOL ?Take 2 tablets (650 mg total) by mouth every 6 (six) hours as needed for mild pain or moderate pain (or Fever >/= 101). ?  ?clonazePAM 0.5 MG tablet ?Commonly known as: KLONOPIN ?Take 0.25 mg by mouth daily as needed for anxiety. ?  ?methimazole 5 MG tablet ?Commonly known as: TAPAZOLE ?Take 1 tablet (5 mg total) by mouth  3 (three) times daily. ?What changed:  ?medication strength ?when to take this ?  ?propranolol 10 MG tablet ?Commonly known as: INDERAL ?Take 2 tablets (20 mg total) by mouth 2 (two) times daily. ?What changed: medication strength ?  ? ?  ? ?No Known Allergies ? ? ? ?The results of significant diagnostics from this hospitalization (including imaging, microbiology, ancillary and laboratory) are listed below for reference.   ? ?Significant Diagnostic Studies: ?DG Chest 2 View ? ?Result Date: 09/24/2021 ?CLINICAL DATA:  Chest pain fatigue EXAM: CHEST - 2 VIEW COMPARISON:  05/11/2017 FINDINGS: The heart size and mediastinal contours are within normal limits. Both lungs are clear. The visualized skeletal structures are unremarkable. IMPRESSION: No active cardiopulmonary disease. Electronically Signed   By: Judie Petit.  Shick M.D.   On: 09/24/2021 16:47  ? ?CT Head Wo Contrast ? ?Result Date: 09/24/2021 ?CLINICAL DATA:  Dizziness.  Fatigue, exhaustion and chest pressure EXAM: CT HEAD WITHOUT CONTRAST TECHNIQUE: Contiguous axial images were obtained from the base of the skull through the vertex without intravenous contrast. RADIATION DOSE REDUCTION: This exam was performed according to the departmental dose-optimization program which includes automated exposure control, adjustment of the mA and/or kV according to patient size and/or use of iterative reconstruction technique. COMPARISON:  None. FINDINGS: Brain: No evidence of acute infarction, hemorrhage, hydrocephalus, extra-axial collection or mass lesion/mass effect. Vascular: No hyperdense vessel or unexpected calcification. Skull: Normal. Negative for fracture or focal lesion. Sinuses/Orbits: No acute finding. Other: None IMPRESSION: 1. No acute intracranial abnormalities.  Normal brain. Electronically Signed   By: Signa Kell M.D.   On: 09/24/2021 21:00  ? ?NM Myocar Multi W/Spect W/Wall Motion / EF ? ?Result Date: 09/25/2021 ?CLINICAL DATA:  Chest pain EXAM: MYOCARDIAL  IMAGING WITH SPECT (REST AND PHARMACOLOGIC-STRESS) GATED LEFT VENTRICULAR WALL MOTION STUDY LEFT VENTRICULAR EJECTION FRACTION TECHNIQUE: Standard myocardial SPECT imaging was performed after resting intravenous injection of 10.9 mCi Tc-10m tetrofosmin. Subsequently, intravenous infusion of Lexiscan was performed under the supervision of the Cardiology staff. At peak effect of the drug, 30.3 mCi Tc-85m tetrofosmin was injected intravenously and standard myocardial SPECT imaging was performed. Quantitative gated imaging was also performed to evaluate left ventricular wall motion, and estimate left ventricular ejection fraction. COMPARISON:  None. FINDINGS: Perfusion: No decreased activity in the left ventricle on stress imaging to suggest reversible ischemia. Small in size, moderate in severity, fixed defect involving the apical anterior and apicolateral segments. There is no corresponding wall motion abnormality. Wall Motion: Normal left ventricular wall motion. No left ventricular dilation. Left Ventricular Ejection Fraction: 64 % End diastolic volume 92 ml End systolic volume 33 ml IMPRESSION: 1. No reversible ischemia. Small fixed defect is noted involving the apical anterior and apicolateral segments. No corresponding wall motion abnormality. This is favored to represent non uniform soft tissue attenuation artifact. 2. Normal left ventricular wall motion. 3. Left ventricular ejection fraction 64% 4. Non invasive risk stratification*: Low *2012 Appropriate  Use Criteria for Coronary Revascularization Focused Update: J Am Coll Cardiol. 2012;59(9):857-881. http://content.dementiazones.com.aspx?articleid=[email or web address] Electronically Signed   By: Signa Kell M.D.   On: 09/25/2021 13:19  ? ?ECHOCARDIOGRAM COMPLETE ? ?Result Date: 09/25/2021 ?   ECHOCARDIOGRAM REPORT   Patient Name:   VINCENZO STAVE Date of Exam: 09/25/2021 Medical Rec #:  [id number]       Height:       64.0 in Accession #:    [id number]      Weight:        175.9 lb Date of Birth:  June 16, 1980       BSA:          1.853 m? Patient Age:    41 years        BP:           94/62 mmHg Patient Gender: M               HR:           69 bpm. Exam Location:  Inpatient P

## 2021-09-26 NOTE — Telephone Encounter (Signed)
error 

## 2021-09-29 ENCOUNTER — Ambulatory Visit (INDEPENDENT_AMBULATORY_CARE_PROVIDER_SITE_OTHER): Payer: 59 | Admitting: "Endocrinology

## 2021-09-29 ENCOUNTER — Encounter: Payer: Self-pay | Admitting: "Endocrinology

## 2021-09-29 VITALS — BP 98/68 | HR 72 | Ht 64.0 in | Wt 173.8 lb

## 2021-09-29 DIAGNOSIS — E0590 Thyrotoxicosis, unspecified without thyrotoxic crisis or storm: Secondary | ICD-10-CM

## 2021-09-29 MED ORDER — METHIMAZOLE 5 MG PO TABS: 5.0000 mg | ORAL_TABLET | Freq: Every day | ORAL | 1 refills | Status: DC

## 2021-09-29 NOTE — Progress Notes (Signed)
? ? ? 09/29/2021   ? ? ? ?Endocrinology follow-up note ? ? ?Subjective:  ? ? Patient ID: Brandon Chase, male    DOB: [birth year], PCP Brandon Slick, MD. ? ? ?Past Medical History:  ?Diagnosis Date  ? Palpitations   ? ? ?Past Surgical History:  ?Procedure Laterality Date  ? CYST REMOVAL TRUNK    ? Also had a cyst removed from right side of cheek  ? Cyst removed  06/01/2004  ? ? ?Social History  ? ?Socioeconomic History  ? Marital status: Married  ?  Spouse name: Not on file  ? Number of children: 2  ? Years of education: Not on file  ? Highest education level: Not on file  ?Occupational History  ? Occupation: Personnel officer  ?  Comment: Company secretary  ?Tobacco Use  ? Smoking status: Never  ? Smokeless tobacco: Never  ?Vaping Use  ? Vaping Use: Never used  ?Substance and Sexual Activity  ? Alcohol use: No  ? Drug use: No  ? Sexual activity: Not on file  ?Other Topics Concern  ? Not on file  ?Social History Narrative  ? Not on file  ? ?Social Determinants of Health  ? ?Financial Resource Strain: Not on file  ?Food Insecurity: Not on file  ?Transportation Needs: Not on file  ?Physical Activity: Not on file  ?Stress: Not on file  ?Social Connections: Not on file  ? ? ?Family History  ?Problem Relation Age of Onset  ? Thyroid disease Mother   ? ? ?Outpatient Encounter Medications as of 09/29/2021  ?Medication Sig  ? acetaminophen (TYLENOL) 325 MG tablet Take 2 tablets (650 mg total) by mouth every 6 (six) hours as needed for mild pain or moderate pain (or Fever >/= 101).  ? clonazePAM (KLONOPIN) 0.5 MG tablet Take 0.25 mg by mouth daily as needed for anxiety.  ? methimazole (TAPAZOLE) 5 MG tablet Take 1 tablet (5 mg total) by mouth daily with breakfast.  ? propranolol (INDERAL) 10 MG tablet Take 2 tablets (20 mg total) by mouth 2 (two) times daily. (Patient taking differently: Take 10 mg by mouth 2 (two) times daily.)  ? [DISCONTINUED] methimazole (TAPAZOLE) 5 MG tablet Take 1 tablet (5 mg total) by mouth 3 (three)  times daily.  ? ?No facility-administered encounter medications on file as of 09/29/2021.  ? ? ?ALLERGIES: ?No Known Allergies ? ?VACCINATION STATUS: ? ?There is no immunization history on file for this patient. ? ? ?HPI ? ?Brandon Chase is 41 y.o. male who presents today, accompanied by his wife, with a medical history as above. he was recently diagnosed with hyperthyroidism and was started on methimazole for control of his thyroid before considering thyroid uptake and scan and possible radioactive iodine ablation.   ?Patient reports continued symptoms including anxiety, tremors, palpitations.  He presents with fluctuating energy level, body weight.  His most recent lab work show improvement in his thyroid profile. ?He was recently worked up and ruled out for cardiac etiologies. ?He is currently on methimazole 5 mg 3 times daily p.o., propranolol 10 mg p.o. twice daily.   ? ?he denies dysphagia, choking, shortness of breath, no recent voice change.  ?  ?he does have family history of thyroid dysfunction in his mother and grandparents (thinks hypothyroidism), but denies family hx of thyroid cancer. he denies personal history of goiter.  Denies use of Biotin containing supplements.  he is willing to proceed with appropriate work up and therapy for thyrotoxicosis. ? ?He  does note improvement in his symptoms since starting the Methimazole. ? ?Review of systems ? ?Constitutional: + Regained some of the weight he lost, + fatigue.  Body mass index is 29.83 kg/m?., no fatigue, no subjective hyperthermia, no subjective hypothermia, constant headaches ?Eyes: no blurry vision, no xerophthalmia ?ENT: no sore throat, no nodules palpated in throat, no dysphagia/odynophagia, no hoarseness ?Cardiovascular: no chest pain, no shortness of breath, - palpitations, no leg swelling ?Respiratory: no cough, no shortness of breath ?Gastrointestinal: no nausea/vomiting/diarrhea ?Musculoskeletal: no muscle/joint aches ?Skin: no rashes, no  hyperemia ?Neurological: + tremors, no numbness, no tingling, no dizziness ?Psychiatric: no depression, + anxiety ? ? ?Objective:  ?  ?BP 98/68   Pulse 72   Ht 5\' 4"  (1.626 m)   Wt 173 lb 12.8 oz (78.8 kg)   BMI 29.83 kg/m?   ?Wt Readings from Last 3 Encounters:  ?09/29/21 173 lb 12.8 oz (78.8 kg)  ?09/24/21 175 lb 14.8 oz (79.8 kg)  ?08/28/21 168 lb 6.4 oz (76.4 kg)  ?  ? ?BP Readings from Last 3 Encounters:  ?09/29/21 98/68  ?09/25/21 120/68  ?08/28/21 97/62  ?         ? ?Physical Exam- Limited ? ?Constitutional:  Body mass index is 29.83 kg/m?. , not in acute distress, normal state of mind ?Eyes:  EOMI, no exophthalmos ?Neck: Supple ?Thyroid: No gross goiter, ? mildly enlarged right side, no palpable nodularity ?Cardiovascular: RRR, no murmurs, rubs, or gallops, no edema ?Respiratory: Adequate breathing efforts, no crackles, rales, rhonchi, or wheezing ?Musculoskeletal: no gross deformities, strength intact in all four extremities, no gross restriction of joint movements ?Skin:  no rashes, no hyperemia ?Neurological: + tremor with outstretched hands ? ? ?CMP  ?   ?Component Value Date/Time  ? NA 140 09/25/2021 0332  ? K 3.6 09/25/2021 0332  ? CL 109 09/25/2021 0332  ? CO2 26 09/25/2021 0332  ? GLUCOSE 107 (H) 09/25/2021 0332  ? BUN 11 09/25/2021 0332  ? CREATININE 0.94 09/25/2021 0332  ? CALCIUM 8.7 (L) 09/25/2021 0332  ? PROT 5.4 (L) 09/25/2021 0332  ? ALBUMIN 3.1 (L) 09/25/2021 0332  ? AST 20 09/25/2021 0332  ? ALT 31 09/25/2021 0332  ? ALKPHOS 41 09/25/2021 0332  ? BILITOT 0.4 09/25/2021 0332  ? GFRNONAA >60 09/25/2021 0332  ? GFRAA >90 02/02/2012 1752  ? ? ? ?CBC ?   ?Component Value Date/Time  ? WBC 4.4 09/25/2021 0332  ? RBC 4.58 09/25/2021 0332  ? HGB 13.5 09/25/2021 0332  ? HCT 39.7 09/25/2021 0332  ? PLT 234 09/25/2021 0332  ? MCV 86.7 09/25/2021 0332  ? MCH 29.5 09/25/2021 0332  ? MCHC 34.0 09/25/2021 0332  ? RDW 12.3 09/25/2021 0332  ? LYMPHSABS 1.7 02/02/2012 1752  ? MONOABS 0.5 02/02/2012 1752  ?  EOSABS 0.0 02/02/2012 1752  ? BASOSABS 0.1 02/02/2012 1752  ? ? ? ?Diabetic Labs (most recent): ?No results found for: HGBA1C ? ?Lipid Panel  ?No results found for: CHOL, TRIG, HDL, CHOLHDL, VLDL, LDLCALC, LDLDIRECT, LABVLDL ? ? ?Lab Results  ?Component Value Date  ? TSH <0.010 (L) 09/24/2021  ? TSH <0.005 (L) 09/15/2021  ? TSH <0.005 (L) 08/28/2021  ? TSH 0.01 (A) 08/15/2021  ? FREET4 1.17 (H) 09/24/2021  ? FREET4 1.77 09/15/2021  ? FREET4 2.23 (H) 08/28/2021  ?  ? ?08/15/21 0000   ?Result status: Final  ?Resulting lab: OTHER  ?Reference range: 0.41 - 5.90  ?Value: 0.01 Abnormal    ?Comment: TSH<0.005; FT4-2.95,  FT3 8.5; Thyro AB <1; Thyro Stim AB0.16  ? ? ? ?Assessment & Plan:  ? ?1. Hyperthyroidism-unspecified ?For long-term, his best option of treatment for hyperthyroidism will be radioactive iodine thyroid ablation. ?He is on methimazole treatment to achieve control of thyroid to safe level before considering thyroid uptake and scan and possible RAI thyroid ablation.  Considering his presentation with improving thyroid profile, he is advised to lower methimazole to 5 mg p.o. daily at breakfast, continue propranolol 10 mg p.o. twice daily.  He will have repeat thyroid function test in 3 weeks.  If he returns with controlled thyroid profile, he will be taken off of methimazole for thyroid uptake and scan. ?Given his fluctuating energy level, he will benefit from adrenal assessment.  I discussed and added a.m. cortisol to his labs. ?He is encouraged to call clinic if he has continued symptoms, briefly will be considered for prednisone 5-10 mg p.o. daily. ?-Patient is made aware of the high likelihood of post ablative hypothyroidism with subsequent need for lifelong thyroid hormone replacement should he need thyroid ablation with radioactive iodine.  he understands this outcome and he is  willing to proceed.     ? ?he will return in 4 weeks for treatment decision.   ? ? ?-Patient is advised to maintain close follow  up with Brandon Slick, MD for primary care needs. ? ? ?I spent 24 minutes in the care of the patient today including review of labs from Thyroid Function, CMP, and other relevant labs ; imaging/biopsy

## 2021-10-01 ENCOUNTER — Ambulatory Visit: Payer: BLUE CROSS/BLUE SHIELD | Admitting: Nurse Practitioner

## 2021-10-20 ENCOUNTER — Ambulatory Visit: Payer: BLUE CROSS/BLUE SHIELD | Admitting: Nurse Practitioner

## 2021-10-21 LAB — TSH: TSH: 0.008 u[IU]/mL — ABNORMAL LOW (ref 0.450–4.500)

## 2021-10-21 LAB — T4, FREE: Free T4: 1.66 ng/dL (ref 0.82–1.77)

## 2021-10-21 LAB — THYROGLOBULIN ANTIBODY: Thyroglobulin Antibody: <1 IU/mL (ref 0.0–0.9)

## 2021-10-21 LAB — CORTISOL-AM, BLOOD: Cortisol - AM: 10.5 ug/dL (ref 6.2–19.4)

## 2021-10-28 ENCOUNTER — Encounter: Payer: Self-pay | Admitting: "Endocrinology

## 2021-10-28 ENCOUNTER — Ambulatory Visit (INDEPENDENT_AMBULATORY_CARE_PROVIDER_SITE_OTHER): Payer: 59 | Admitting: "Endocrinology

## 2021-10-28 VITALS — BP 114/66 | HR 76 | Ht 64.0 in | Wt 175.4 lb

## 2021-10-28 DIAGNOSIS — E0590 Thyrotoxicosis, unspecified without thyrotoxic crisis or storm: Secondary | ICD-10-CM

## 2021-10-28 NOTE — Progress Notes (Signed)
10/28/2021      Endocrinology follow-up note   Subjective:    Patient ID: Brandon Chase, male    DOB: Nov 09, 1980, PCP Brandon Slick, MD.   Past Medical History:  Diagnosis Date   Palpitations     Past Surgical History:  Procedure Laterality Date   CYST REMOVAL TRUNK     Also had a cyst removed from right side of cheek   Cyst removed  06/01/2004    Social History   Socioeconomic History   Marital status: Married    Spouse name: Not on file   Number of children: 2   Years of education: Not on file   Highest education level: Not on file  Occupational History   Occupation: Personnel officer    Comment: Company secretary  Tobacco Use   Smoking status: Never   Smokeless tobacco: Never  Vaping Use   Vaping Use: Never used  Substance and Sexual Activity   Alcohol use: No   Drug use: No   Sexual activity: Not on file  Other Topics Concern   Not on file  Social History Narrative   Not on file   Social Determinants of Health   Financial Resource Strain: Not on file  Food Insecurity: Not on file  Transportation Needs: Not on file  Physical Activity: Not on file  Stress: Not on file  Social Connections: Not on file    Family History  Problem Relation Age of Onset   Thyroid disease Mother     Outpatient Encounter Medications as of 10/28/2021  Medication Sig   acetaminophen (TYLENOL) 325 MG tablet Take 2 tablets (650 mg total) by mouth every 6 (six) hours as needed for mild pain or moderate pain (or Fever >/= 101).   clonazePAM (KLONOPIN) 0.5 MG tablet Take 0.25 mg by mouth daily as needed for anxiety.   methimazole (TAPAZOLE) 5 MG tablet Take 1 tablet (5 mg total) by mouth daily with breakfast.   propranolol (INDERAL) 10 MG tablet Take 2 tablets (20 mg total) by mouth 2 (two) times daily. (Patient taking differently: Take 10 mg by mouth 2 (two) times daily.)   No facility-administered encounter medications on file as of 10/28/2021.    ALLERGIES: No  Known Allergies  VACCINATION STATUS:  There is no immunization history on file for this patient.   HPI  Brandon Chase is 41 Chase.o. male who presents today, accompanied by his wife, with a medical history as above. he was recently diagnosed with hyperthyroidism and was started on methimazole for control of his thyroid before considering thyroid uptake and scan and possible radioactive iodine ablation.   Patient reports improved symptoms.  His previsit labs are consistent with controlled thyroid function test except for lacking TSH at 0.008.    He was recently worked up and ruled out for cardiac etiologies. He is currently on methimazole 5 mg once a day at breakfast,  propranolol 10 mg p.o. twice daily.    he denies dysphagia, choking, shortness of breath, no recent voice change.    he does have family history of thyroid dysfunction in his mother and grandparents (thinks hypothyroidism), but denies family hx of thyroid cancer. he denies personal history of goiter.  Denies use of Biotin containing supplements.  he is willing to proceed with appropriate work up and therapy for thyrotoxicosis.  He does note improvement in his symptoms since starting the Methimazole.  Review of systems  Constitutional: + Regained some of the weight he  lost, + fatigue.  Body mass index is 30.11 kg/m., no fatigue, no subjective hyperthermia, no subjective hypothermia, constant headaches Eyes: no blurry vision, no xerophthalmia ENT: no sore throat, no nodules palpated in throat, no dysphagia/odynophagia, no hoarseness Cardiovascular: no chest pain, no shortness of breath, - palpitations, no leg swelling Respiratory: no cough, no shortness of breath Gastrointestinal: no nausea/vomiting/diarrhea Musculoskeletal: no muscle/joint aches Skin: no rashes, no hyperemia Neurological: + tremors, no numbness, no tingling, no dizziness Psychiatric: no depression, + anxiety   Objective:    BP 114/66   Pulse 76   Ht  5\' 4"  (1.626 m)   Wt 175 lb 6.4 oz (79.6 kg)   BMI 30.11 kg/m   Wt Readings from Last 3 Encounters:  10/28/21 175 lb 6.4 oz (79.6 kg)  09/29/21 173 lb 12.8 oz (78.8 kg)  09/24/21 175 lb 14.8 oz (79.8 kg)     BP Readings from Last 3 Encounters:  10/28/21 114/66  09/29/21 98/68  09/25/21 120/68            Physical Exam- Limited  Constitutional:  Body mass index is 30.11 kg/m. , not in acute distress, normal state of mind Eyes:  EOMI, no exophthalmos Neck: Supple Thyroid: No gross goiter, ? mildly enlarged right side, no palpable nodularity    CMP     Component Value Date/Time   NA 140 09/25/2021 0332   K 3.6 09/25/2021 0332   CL 109 09/25/2021 0332   CO2 26 09/25/2021 0332   GLUCOSE 107 (H) 09/25/2021 0332   BUN 11 09/25/2021 0332   CREATININE 0.94 09/25/2021 0332   CALCIUM 8.7 (L) 09/25/2021 0332   PROT 5.4 (L) 09/25/2021 0332   ALBUMIN 3.1 (L) 09/25/2021 0332   AST 20 09/25/2021 0332   ALT 31 09/25/2021 0332   ALKPHOS 41 09/25/2021 0332   BILITOT 0.4 09/25/2021 0332   GFRNONAA >60 09/25/2021 0332   GFRAA >90 02/02/2012 1752     CBC    Component Value Date/Time   WBC 4.4 09/25/2021 0332   RBC 4.58 09/25/2021 0332   HGB 13.5 09/25/2021 0332   HCT 39.7 09/25/2021 0332   PLT 234 09/25/2021 0332   MCV 86.7 09/25/2021 0332   MCH 29.5 09/25/2021 0332   MCHC 34.0 09/25/2021 0332   RDW 12.3 09/25/2021 0332   LYMPHSABS 1.7 02/02/2012 1752   MONOABS 0.5 02/02/2012 1752   EOSABS 0.0 02/02/2012 1752   BASOSABS 0.1 02/02/2012 1752     Diabetic Labs (most recent): No results found for: HGBA1C  Lipid Panel  No results found for: CHOL, TRIG, HDL, CHOLHDL, VLDL, LDLCALC, LDLDIRECT, LABVLDL   Lab Results  Component Value Date   TSH 0.008 (L) 10/20/2021   TSH <0.010 (L) 09/24/2021   TSH <0.005 (L) 09/15/2021   TSH <0.005 (L) 08/28/2021   TSH 0.01 (A) 08/15/2021   FREET4 1.66 10/20/2021   FREET4 1.17 (H) 09/24/2021   FREET4 1.77 09/15/2021   FREET4 2.23  (H) 08/28/2021     08/15/21 0000   Result status: Final  Resulting lab: OTHER  Reference range: 0.41 - 5.90  Value: 0.01 Abnormal    Comment: TSH<0.005; FT4-2.95, FT3 8.5; Thyro AB <1; Thyro Stim AB0.16     Assessment & Plan:   1. Hyperthyroidism-unspecified His thyroid profile is adequately controlled to hold methimazole and do thyroid uptake and scan and clinically evaluated.  If he has significant uptake or confirms Graves' disease, he will be considered for radioactive iodine thyroid ablation. He is  advised to stay off of methimazole until next visit.  He is advised to continue propanolol 10 mg p.o. twice daily.   Given his fluctuating energy level, he was offered adrenal assessment, a.m. cortisol is normal.   -Patient is made aware of the high likelihood of post ablative hypothyroidism with subsequent need for lifelong thyroid hormone replacement should he need thyroid ablation with radioactive iodine.  he understands this outcome and he is  willing to proceed.      -Patient is advised to maintain close follow up with Brandon Chase, Brandon Y, MD for primary care needs.   I spent 23 minutes in the care of the patient today including review of labs from Thyroid Function, CMP, and other relevant labs ; imaging/biopsy records (current and previous including abstractions from other facilities); face-to-face time discussing  his lab results and symptoms, medications doses, his options of short and long term treatment based on the latest standards of care / guidelines;   and documenting the encounter.  Brandon Chase  participated in the discussions, expressed understanding, and voiced agreement with the above plans.  All questions were answered to his satisfaction. he is encouraged to contact clinic should he have any questions or concerns prior to his return visit.  Follow up plan: Return in about 2 weeks (around 11/11/2021) for F/U with Thyroid Uptake and Scan.   Thank you for involving me  in the care of this pleasant patient, and I will continue to update you with his progress.   Marquis LunchGebre Blayden Conwell, MD [facility] [address] [place], [place] [address] Phone: [phone] Fax: [phone]  10/28/2021, 6:55 PM

## 2021-11-05 ENCOUNTER — Encounter (HOSPITAL_COMMUNITY)
Admission: RE | Admit: 2021-11-05 | Discharge: 2021-11-05 | Disposition: A | Discharge status: Home / Self Care | Payer: 59 | Source: Ambulatory Visit | Attending: "Endocrinology | Admitting: "Endocrinology

## 2021-11-05 DIAGNOSIS — E0590 Thyrotoxicosis, unspecified without thyrotoxic crisis or storm: Secondary | ICD-10-CM | POA: Diagnosis present

## 2021-11-05 MED ORDER — SODIUM IODIDE I-123 7.4 MBQ CAPS
463.0000 | ORAL_CAPSULE | Freq: Once | ORAL | Status: AC
  Administered 2021-11-05: 463 via ORAL

## 2021-11-06 ENCOUNTER — Encounter (HOSPITAL_COMMUNITY)
Admission: RE | Admit: 2021-11-06 | Discharge: 2021-11-06 | Disposition: A | Discharge status: Home / Self Care | Payer: 59 | Source: Ambulatory Visit | Attending: "Endocrinology | Admitting: "Endocrinology

## 2021-11-10 ENCOUNTER — Ambulatory Visit: Payer: 59 | Admitting: "Endocrinology

## 2021-11-10 ENCOUNTER — Encounter: Payer: Self-pay | Admitting: "Endocrinology

## 2021-11-10 ENCOUNTER — Telehealth: Payer: Self-pay | Admitting: "Endocrinology

## 2021-11-10 VITALS — BP 100/72 | HR 88 | Ht 64.0 in | Wt 176.2 lb

## 2021-11-10 DIAGNOSIS — E0500 Thyrotoxicosis with diffuse goiter without thyrotoxic crisis or storm: Secondary | ICD-10-CM

## 2021-11-10 DIAGNOSIS — E0590 Thyrotoxicosis, unspecified without thyrotoxic crisis or storm: Secondary | ICD-10-CM | POA: Diagnosis not present

## 2021-11-10 MED ORDER — PREDNISONE 10 MG PO TABS: 10.0000 mg | ORAL_TABLET | Freq: Every day | ORAL | 0 refills | Status: DC

## 2021-11-10 NOTE — Progress Notes (Signed)
Pt has been off of methimazole for past 2 weeks, experiencing worsening dizziness, fatigue, irritability, feeling faint.

## 2021-11-10 NOTE — Telephone Encounter (Signed)
Pt called and states [facility] has him scheduled for 6/14 this Wednesday for the [facility]. Patient wife states you told them you would write him out of work for a week starting with the date he has his scan. Is that correct? Do you want me to write a note excusing patient from work 6/14-6/21 returning on 6/22?

## 2021-11-10 NOTE — Written Directive (Addendum)
  I-131 WHOLE THYROID THERAPY (NON-CANCER)    RADIOPHARMACEUTICAL:   Iodine-131 Capsule    PRESCRIBED DOSE FOR ADMINISTRATION: 20 (twenty) millicuries   ROUTE OFADMINISTRATION: PO   DIAGNOSIS:  Graves Disease/ Hyperthyroidism   REFERRING PHYSICIAN: Nida   TSH:    Lab Results  Component Value Date   TSH 0.008 (L) 10/20/2021   TSH <0.010 (L) 09/24/2021   TSH <0.005 (L) 09/15/2021     PRIOR I-131 THERAPY (Date and Dose):   PRIOR RADIOLOGY EXAMS (Results and Date): NM THYROID MULT UPTAKE W/IMAGING  Result Date: 11/06/2021 CLINICAL DATA:  Hyperthyroidism, TSH 0.008, symptomatic EXAM: THYROID SCAN AND UPTAKE - 4 AND 24 HOURS TECHNIQUE: Following oral administration of I-123 capsule, anterior planar imaging was acquired at 24 hours. Thyroid uptake was calculated with a thyroid probe at 4-6 hours and 24 hours. RADIOPHARMACEUTICALS:  463 uCi I-123 sodium iodide p.o. COMPARISON:  None Available. FINDINGS: Homogeneous tracer distribution in both thyroid lobes. No focal areas of increased or decreased tracer localization. 4 hour I-123 uptake = 34.1% (normal 5-20%) 24 hour I-123 uptake = 45.7% (normal 10-30%) IMPRESSION: Normal thyroid scan with elevated 4 hour and 24 hour radio iodine uptakes. Findings are consistent with Graves disease. Electronically Signed   By: Ulyses Southward M.D.   On: 11/06/2021 10:29      ADDITIONAL PHYSICIAN COMMENTS/NOTES   AUTHORIZED USER SIGNATURE & TIME STAMP:

## 2021-11-10 NOTE — Progress Notes (Signed)
11/10/2021      Endocrinology follow-up note   Subjective:    Patient ID: Brandon Chase, male    DOB: [birth year], PCP Suzan Slick, MD.   Past Medical History:  Diagnosis Date   Palpitations     Past Surgical History:  Procedure Laterality Date   CYST REMOVAL TRUNK     Also had a cyst removed from right side of cheek   Cyst removed  06/01/2004    Social History   Socioeconomic History   Marital status: Married    Spouse name: Not on file   Number of children: 2   Years of education: Not on file   Highest education level: Not on file  Occupational History   Occupation: Personnel officer    Comment: Company secretary  Tobacco Use   Smoking status: Never   Smokeless tobacco: Never  Vaping Use   Vaping Use: Never used  Substance and Sexual Activity   Alcohol use: No   Drug use: No   Sexual activity: Not on file  Other Topics Concern   Not on file  Social History Narrative   Not on file   Social Determinants of Health   Financial Resource Strain: Not on file  Food Insecurity: Not on file  Transportation Needs: Not on file  Physical Activity: Not on file  Stress: Not on file  Social Connections: Not on file    Family History  Problem Relation Age of Onset   Thyroid disease Mother     Outpatient Encounter Medications as of 11/10/2021  Medication Sig   predniSONE (DELTASONE) 10 MG tablet Take 1 tablet (10 mg total) by mouth daily with breakfast.   acetaminophen (TYLENOL) 325 MG tablet Take 2 tablets (650 mg total) by mouth every 6 (six) hours as needed for mild pain or moderate pain (or Fever >/= 101).   clonazePAM (KLONOPIN) 0.5 MG tablet Take 0.25 mg by mouth daily as needed for anxiety.   propranolol (INDERAL) 10 MG tablet Take 2 tablets (20 mg total) by mouth 2 (two) times daily. (Patient taking differently: Take 10 mg by mouth 2 (two) times daily.)   [DISCONTINUED] methimazole (TAPAZOLE) 5 MG tablet Take 1 tablet (5 mg total) by mouth daily  with breakfast. (Patient not taking: Reported on 11/10/2021)   No facility-administered encounter medications on file as of 11/10/2021.    ALLERGIES: No Known Allergies  VACCINATION STATUS:  There is no immunization history on file for this patient.   HPI  Brandon Chase is 41 y.o. male who presents today, accompanied by his wife, with a medical history as above. he was recently diagnosed with hyperthyroidism and was briefly treated with methimazole with modest control of his thyrotoxicosis.  Subsequently, he underwent thyroid uptake and scan in preparation for   possible radioactive iodine ablation.   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 Patient continues to have symptoms including anxiety, fatigue.  He is off of his methimazole since last visit, his previsit labs are consistent with controlled thyroid function test except for lacking TSH at 0.008.    He was recently worked up and ruled out for cardiac etiologies. He is currently on propranolol 10 mg p.o. twice daily.    he denies dysphagia, choking, shortness of breath, no recent voice change.    he does have family history of thyroid dysfunction in his mother and grandparents (thinks hypothyroidism), but denies family hx of thyroid cancer. he denies personal  history of goiter.  Denies use of Biotin containing supplements.  he is willing to proceed with appropriate work up and therapy for thyrotoxicosis.  He does note improvement in his symptoms since starting the Methimazole.  Review of systems  Constitutional: + Regained some of the weight he lost, + fatigue.  Body mass index is 30.24 kg/m., no fatigue, no subjective hyperthermia, no subjective hypothermia, constant headaches Eyes: no blurry vision, no xerophthalmia ENT: no sore throat, no nodules palpated in throat, no dysphagia/odynophagia, no hoarseness Cardiovascular: no chest pain, no shortness of breath, - palpitations, no  leg swelling Respiratory: no cough, no shortness of breath Gastrointestinal: no nausea/vomiting/diarrhea Musculoskeletal: no muscle/joint aches Skin: no rashes, no hyperemia Neurological: + tremors, no numbness, no tingling, no dizziness Psychiatric: no depression, + anxiety   Objective:    BP 100/72   Pulse 88   Ht 5\' 4"  (1.626 m)   Wt 176 lb 3.2 oz (79.9 kg)   BMI 30.24 kg/m   Wt Readings from Last 3 Encounters:  11/10/21 176 lb 3.2 oz (79.9 kg)  10/28/21 175 lb 6.4 oz (79.6 kg)  09/29/21 173 lb 12.8 oz (78.8 kg)     BP Readings from Last 3 Encounters:  11/10/21 100/72  10/28/21 114/66  09/29/21 98/68            Physical Exam- Limited  Constitutional:  Body mass index is 30.24 kg/m. , not in acute distress, normal state of mind Eyes:  EOMI, no exophthalmos Neck: Supple Thyroid: No gross goiter, ? mildly enlarged right side, no palpable nodularity    CMP     Component Value Date/Time   NA 140 09/25/2021 0332   K 3.6 09/25/2021 0332   CL 109 09/25/2021 0332   CO2 26 09/25/2021 0332   GLUCOSE 107 (H) 09/25/2021 0332   BUN 11 09/25/2021 0332   CREATININE 0.94 09/25/2021 0332   CALCIUM 8.7 (L) 09/25/2021 0332   PROT 5.4 (L) 09/25/2021 0332   ALBUMIN 3.1 (L) 09/25/2021 0332   AST 20 09/25/2021 0332   ALT 31 09/25/2021 0332   ALKPHOS 41 09/25/2021 0332   BILITOT 0.4 09/25/2021 0332   GFRNONAA >60 09/25/2021 0332   GFRAA >90 02/02/2012 1752     CBC    Component Value Date/Time   WBC 4.4 09/25/2021 0332   RBC 4.58 09/25/2021 0332   HGB 13.5 09/25/2021 0332   HCT 39.7 09/25/2021 0332   PLT 234 09/25/2021 0332   MCV 86.7 09/25/2021 0332   MCH 29.5 09/25/2021 0332   MCHC 34.0 09/25/2021 0332   RDW 12.3 09/25/2021 0332   LYMPHSABS 1.7 02/02/2012 1752   MONOABS 0.5 02/02/2012 1752   EOSABS 0.0 02/02/2012 1752   BASOSABS 0.1 02/02/2012 1752     Lab Results  Component Value Date   TSH 0.008 (L) 10/20/2021   TSH <0.010 (L) 09/24/2021   TSH <0.005  (L) 09/15/2021   TSH <0.005 (L) 08/28/2021   TSH 0.01 (A) 08/15/2021   FREET4 1.66 10/20/2021   FREET4 1.17 (H) 09/24/2021   FREET4 1.77 09/15/2021   FREET4 2.23 (H) 08/28/2021     November 06, 2021, thyroid uptake and scan results:  FINDINGS: Homogeneous tracer distribution in both thyroid lobes.  No focal areas of increased or decreased tracer localization.   4 hour I-123 uptake = 34.1% (normal 5-20%),  24 hour I-123 uptake = 45.7% (normal 10-30%)   IMPRESSION: Normal thyroid scan with elevated 4 hour and 24 hour radio iodine uptakes.  Findings are consistent with Graves disease.   Assessment & Plan:   1. Hyperthyroidism- 2.  Graves' disease He presents with his work-up confirming primary hyperthyroidism from Graves' disease.   Best option of treatment was discussed with him.  He is advised to discontinue methimazole.  Care plan was discussed with him and his wife in the exam room.  He agrees with my suggestion of radioactive iodine thyroid ablation.  This [facility].   To help him with the symptoms of transient thyrotoxicosis, I discussed and prescribed prednisone 10 mg p.o. daily for the next 10 days. Given his fluctuating energy level, he was offered adrenal assessment, a.m. cortisol is normal.   -Patient is made aware of the high likelihood of post ablative hypothyroidism with subsequent need for lifelong thyroid hormone replacement.  he understands this outcome and he is  willing to proceed.    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  -Patient is advised to maintain close follow up with Suzan Slickucker, Alethea Y, MD for primary care needs.   I spent 22 minutes in the care of the patient today including review of labs from Thyroid Function, CMP, and other relevant labs ; imaging/biopsy records (current and previous including abstractions from  other facilities); face-to-face time discussing  his lab results and symptoms, medications doses, his options of short and long term treatment based on the latest standards of care / guidelines;   and documenting the encounter.  Brandon Marionimothy L Winningham  participated in the discussions, expressed understanding, and voiced agreement with the above plans.  All questions were answered to his satisfaction. he is encouraged to contact clinic should he have any questions or concerns prior to his return visit.   Follow up plan: Return in about 9 weeks (around 01/12/2022) for F/U with Labs after I131 Therapy.   Thank you for involving me in the care of this pleasant patient, and I will continue to update you with his progress.   Marquis LunchGebre Verlie Hellenbrand, MD [facility] [address] [place], [place] [address] Phone: [phone] Fax: [phone]  11/10/2021, 1:01 PM

## 2021-11-12 ENCOUNTER — Encounter (HOSPITAL_COMMUNITY)
Admission: RE | Admit: 2021-11-12 | Discharge: 2021-11-12 | Disposition: A | Discharge status: Home / Self Care | Payer: 59 | Source: Ambulatory Visit | Attending: "Endocrinology | Admitting: "Endocrinology

## 2021-11-12 ENCOUNTER — Ambulatory Visit: Payer: 59 | Admitting: "Endocrinology

## 2021-11-12 DIAGNOSIS — E0500 Thyrotoxicosis with diffuse goiter without thyrotoxic crisis or storm: Secondary | ICD-10-CM | POA: Diagnosis present

## 2021-11-12 DIAGNOSIS — E0590 Thyrotoxicosis, unspecified without thyrotoxic crisis or storm: Secondary | ICD-10-CM | POA: Insufficient documentation

## 2021-11-12 MED ORDER — SODIUM IODIDE I 131 CAPSULE
21.6000 | Freq: Once | INTRAVENOUS | Status: AC | PRN
  Administered 2021-11-12: 21.6 via ORAL

## 2021-11-24 ENCOUNTER — Telehealth: Payer: Self-pay | Admitting: "Endocrinology

## 2021-11-26 ENCOUNTER — Ambulatory Visit: Payer: 59 | Admitting: "Endocrinology

## 2021-11-26 ENCOUNTER — Encounter: Payer: Self-pay | Admitting: "Endocrinology

## 2021-11-26 VITALS — BP 104/62 | HR 88 | Ht 64.0 in | Wt 179.2 lb

## 2021-11-26 DIAGNOSIS — E0500 Thyrotoxicosis with diffuse goiter without thyrotoxic crisis or storm: Secondary | ICD-10-CM | POA: Diagnosis not present

## 2021-11-26 DIAGNOSIS — E0590 Thyrotoxicosis, unspecified without thyrotoxic crisis or storm: Secondary | ICD-10-CM

## 2021-11-26 MED ORDER — PREDNISONE 10 MG PO TABS: 10.0000 mg | ORAL_TABLET | Freq: Every day | ORAL | 0 refills | Status: DC

## 2021-11-26 NOTE — Progress Notes (Signed)
11/26/2021    Endocrinology follow-up note   Subjective:    Patient ID: Brandon Chase, male    DOB: May 20, 1981, PCP Suzan Slick, MD.   Past Medical History:  Diagnosis Date   Palpitations     Past Surgical History:  Procedure Laterality Date   CYST REMOVAL TRUNK     Also had a cyst removed from right side of cheek   Cyst removed  06/01/2004    Social History   Socioeconomic History   Marital status: Married    Spouse name: Not on file   Number of children: 2   Years of education: Not on file   Highest education level: Not on file  Occupational History   Occupation: Personnel officer    Comment: Company secretary  Tobacco Use   Smoking status: Never   Smokeless tobacco: Never  Vaping Use   Vaping Use: Never used  Substance and Sexual Activity   Alcohol use: No   Drug use: No   Sexual activity: Not on file  Other Topics Concern   Not on file  Social History Narrative   Not on file   Social Determinants of Health   Financial Resource Strain: Not on file  Food Insecurity: Not on file  Transportation Needs: Not on file  Physical Activity: Not on file  Stress: Not on file  Social Connections: Not on file    Family History  Problem Relation Age of Onset   Thyroid disease Mother     Outpatient Encounter Medications as of 11/26/2021  Medication Sig   acetaminophen (TYLENOL) 325 MG tablet Take 2 tablets (650 mg total) by mouth every 6 (six) hours as needed for mild pain or moderate pain (or Fever >/= 101).   clonazePAM (KLONOPIN) 0.5 MG tablet Take 0.25 mg by mouth daily as needed for anxiety.   predniSONE (DELTASONE) 10 MG tablet Take 1 tablet (10 mg total) by mouth daily with breakfast.   propranolol (INDERAL) 10 MG tablet Take 2 tablets (20 mg total) by mouth 2 (two) times daily. (Patient taking differently: Take 10 mg by mouth 2 (two) times daily.)   [DISCONTINUED] predniSONE (DELTASONE) 10 MG tablet Take 1 tablet (10 mg total) by mouth daily  with breakfast. (Patient not taking: Reported on 11/26/2021)   No facility-administered encounter medications on file as of 11/26/2021.    ALLERGIES: No Known Allergies  VACCINATION STATUS:  There is no immunization history on file for this patient.   HPI  Brandon Chase is 41 y.o. male who presents today, accompanied by his wife, with a medical history as above. he was recently diagnosed with hyperthyroidism and was briefly treated with methimazole with modest control of his thyrotoxicosis.  Subsequently, he underwent thyroid uptake and scan in preparation for radioactive iodine thyroid ablation.  He received this intervention on November 12, 2021.  Over the last several days, he started to feel swelling in his thyroid area, worsening fatigue.  He was doing better when he was on prednisone reportedly.  He ran out of his prednisone prescription.    Patient continues to have symptoms including anxiety, fatigue.    He was recently worked up and ruled out for cardiac etiologies. He is currently on propranolol 10 mg p.o. twice daily.    he denies dysphagia, choking, shortness of breath, no recent voice change.    he does have family history of thyroid dysfunction in his mother and grandparents (thinks hypothyroidism), but denies family hx of thyroid  cancer. he denies personal history of goiter.  Denies use of Biotin containing supplements.  he is willing to proceed with appropriate work up and therapy for thyrotoxicosis.  He does note improvement in his symptoms since starting the Methimazole.  Review of systems  Constitutional: + Regained some of the weight he lost, + fatigue.  Body mass index is 30.76 kg/m., no fatigue, no subjective hyperthermia, no subjective hypothermia.  Eyes: no blurry vision, no xerophthalmia ENT: no sore throat, no nodules palpated in throat, no dysphagia/odynophagia, no hoarseness Cardiovascular: no chest pain, no shortness of breath, - palpitations, no leg  swelling Respiratory: no cough, no shortness of breath Gastrointestinal: no nausea/vomiting/diarrhea Musculoskeletal: no muscle/joint aches Skin: no rashes, no hyperemia Neurological: + tremors, no numbness, no tingling, no dizziness Psychiatric: no depression, + anxiety   Objective:    BP 104/62   Pulse 88   Ht 5\' 4"  (1.626 m)   Wt 179 lb 3.2 oz (81.3 kg)   BMI 30.76 kg/m   Wt Readings from Last 3 Encounters:  11/26/21 179 lb 3.2 oz (81.3 kg)  11/10/21 176 lb 3.2 oz (79.9 kg)  10/28/21 175 lb 6.4 oz (79.6 kg)     BP Readings from Last 3 Encounters:  11/26/21 104/62  11/10/21 100/72  10/28/21 114/66            Physical Exam- Limited  Constitutional:  Body mass index is 30.76 kg/m. , not in acute distress, normal state of mind Eyes:  EOMI, no exophthalmos Neck: Supple Thyroid: No gross goiter, ? mildly enlarged, mildly tender right lobe  thyroid.      CMP     Component Value Date/Time   NA 140 09/25/2021 0332   K 3.6 09/25/2021 0332   CL 109 09/25/2021 0332   CO2 26 09/25/2021 0332   GLUCOSE 107 (H) 09/25/2021 0332   BUN 11 09/25/2021 0332   CREATININE 0.94 09/25/2021 0332   CALCIUM 8.7 (L) 09/25/2021 0332   PROT 5.4 (L) 09/25/2021 0332   ALBUMIN 3.1 (L) 09/25/2021 0332   AST 20 09/25/2021 0332   ALT 31 09/25/2021 0332   ALKPHOS 41 09/25/2021 0332   BILITOT 0.4 09/25/2021 0332   GFRNONAA >60 09/25/2021 0332   GFRAA >90 02/02/2012 1752     CBC    Component Value Date/Time   WBC 4.4 09/25/2021 0332   RBC 4.58 09/25/2021 0332   HGB 13.5 09/25/2021 0332   HCT 39.7 09/25/2021 0332   PLT 234 09/25/2021 0332   MCV 86.7 09/25/2021 0332   MCH 29.5 09/25/2021 0332   MCHC 34.0 09/25/2021 0332   RDW 12.3 09/25/2021 0332   LYMPHSABS 1.7 02/02/2012 1752   MONOABS 0.5 02/02/2012 1752   EOSABS 0.0 02/02/2012 1752   BASOSABS 0.1 02/02/2012 1752     Lab Results  Component Value Date   TSH 0.008 (L) 10/20/2021   TSH <0.010 (L) 09/24/2021   TSH <0.005  (L) 09/15/2021   TSH <0.005 (L) 08/28/2021   TSH 0.01 (A) 08/15/2021   FREET4 1.66 10/20/2021   FREET4 1.17 (H) 09/24/2021   FREET4 1.77 09/15/2021   FREET4 2.23 (H) 08/28/2021     November 06, 2021, thyroid uptake and scan results:  FINDINGS: Homogeneous tracer distribution in both thyroid lobes.  No focal areas of increased or decreased tracer localization.   4 hour I-123 uptake = 34.1% (normal 5-20%),  24 hour I-123 uptake = 45.7% (normal 10-30%)   IMPRESSION: Normal thyroid scan with elevated 4 hour and  24 hour radio iodine uptakes.  Findings are consistent with Graves disease.  November 12, 2021 RADIOPHARMACEUTICALS:  21.6 mCi I-131 sodium iodide orally   IMPRESSION: Per oral administration of I-131 sodium iodide for the treatment of hyperthyroidism.    Assessment & Plan:   1. Hyperthyroidism- 2.  Graves' disease He is status post RAI on November 12, 2021.  He did not have previsit thyroid function test yet.  He called because of neck tenderness, and fatigue.  I had a discussion with him and his wife in the exam room about possible thyroiditis from RAI.  To help with local symptoms, he is prednisone prescription will be extended by another 10 days.  I discussed and prescribed prednisone 10 mg p.o. daily at breakfast for the next 10 days. Given his fluctuating energy level, he was offered adrenal assessment, a.m. cortisol is normal.   -Patient is made aware of the high likelihood of post ablative hypothyroidism with subsequent need for lifelong thyroid hormone replacement.    -Patient is advised to maintain close follow up with Suzan Slick, MD for primary care needs.   I spent 21 minutes in the care of the patient today including review of labs from Thyroid Function, CMP, and other relevant labs ; imaging/biopsy records (current and previous including abstractions from other facilities); face-to-face time discussing  his lab results and symptoms, medications doses, his options of  short and long term treatment based on the latest standards of care / guidelines;   and documenting the encounter.  Brandon Chase  participated in the discussions, expressed understanding, and voiced agreement with the above plans.  All questions were answered to his satisfaction. he is encouraged to contact clinic should he have any questions or concerns prior to his return visit.   Follow up plan: Return for Keep Reg. Appt. with Pre-visit Labs.   Thank you for involving me in the care of this pleasant patient, and I will continue to update you with his progress.   Marquis Lunch, MD [facility] [address] [place], [place] [address] Phone: [phone] Fax: [phone]  11/26/2021, 6:04 PM

## 2021-12-10 ENCOUNTER — Other Ambulatory Visit: Payer: Self-pay | Admitting: "Endocrinology

## 2021-12-10 ENCOUNTER — Other Ambulatory Visit: Payer: Self-pay | Admitting: Nurse Practitioner

## 2021-12-10 MED ORDER — PROPRANOLOL HCL 10 MG PO TABS: 10.0000 mg | ORAL_TABLET | Freq: Two times a day (BID) | ORAL | 2 refills | Status: DC

## 2022-01-07 LAB — T3, FREE: T3, Free: 6.2 pg/mL — ABNORMAL HIGH (ref 2.0–4.4)

## 2022-01-07 LAB — TSH: TSH: <0.005 u[IU]/mL — ABNORMAL LOW (ref 0.450–4.500)

## 2022-01-07 LAB — T4, FREE: Free T4: 2.64 ng/dL — ABNORMAL HIGH (ref 0.82–1.77)

## 2022-01-08 ENCOUNTER — Ambulatory Visit: Payer: 59 | Admitting: "Endocrinology

## 2022-01-08 ENCOUNTER — Telehealth: Payer: Self-pay | Admitting: "Endocrinology

## 2022-01-08 ENCOUNTER — Encounter: Payer: Self-pay | Admitting: "Endocrinology

## 2022-01-08 VITALS — BP 106/68 | HR 72 | Ht 64.0 in | Wt 175.6 lb

## 2022-01-08 DIAGNOSIS — E0500 Thyrotoxicosis with diffuse goiter without thyrotoxic crisis or storm: Secondary | ICD-10-CM | POA: Diagnosis not present

## 2022-01-08 DIAGNOSIS — E0590 Thyrotoxicosis, unspecified without thyrotoxic crisis or storm: Secondary | ICD-10-CM

## 2022-01-08 MED ORDER — PREDNISONE 10 MG PO TABS: 10.0000 mg | ORAL_TABLET | Freq: Every day | ORAL | 0 refills | Status: DC

## 2022-01-08 MED ORDER — PROPRANOLOL HCL 10 MG PO TABS: 10.0000 mg | ORAL_TABLET | Freq: Three times a day (TID) | ORAL | 1 refills | Status: DC

## 2022-01-08 NOTE — Telephone Encounter (Signed)
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

## 2022-01-08 NOTE — Telephone Encounter (Signed)
New message    The patient was seen today asking for a call back from the nurse to discuss work related issues

## 2022-01-08 NOTE — Telephone Encounter (Signed)
Still waiting for a scheduled time for treatment. I will make pt aware.

## 2022-01-08 NOTE — Progress Notes (Signed)
01/08/2022    Endocrinology follow-up note   Subjective:    Patient ID: Brandon Chase, male    DOB: Jun 23, 1980, PCP Suzan Slick, MD.   Past Medical History:  Diagnosis Date   Palpitations     Past Surgical History:  Procedure Laterality Date   CYST REMOVAL TRUNK     Also had a cyst removed from right side of cheek   Cyst removed  06/01/2004    Social History   Socioeconomic History   Marital status: Married    Spouse name: Not on file   Number of children: 2   Years of education: Not on file   Highest education level: Not on file  Occupational History   Occupation: Personnel officer    Comment: Company secretary  Tobacco Use   Smoking status: Never   Smokeless tobacco: Never  Vaping Use   Vaping Use: Never used  Substance and Sexual Activity   Alcohol use: No   Drug use: No   Sexual activity: Not on file  Other Topics Concern   Not on file  Social History Narrative   Not on file   Social Determinants of Health   Financial Resource Strain: Not on file  Food Insecurity: Not on file  Transportation Needs: Not on file  Physical Activity: Not on file  Stress: Not on file  Social Connections: Not on file    Family History  Problem Relation Age of Onset   Thyroid disease Mother     Outpatient Encounter Medications as of 01/08/2022  Medication Sig   acetaminophen (TYLENOL) 325 MG tablet Take 2 tablets (650 mg total) by mouth every 6 (six) hours as needed for mild pain or moderate pain (or Fever >/= 101).   clonazePAM (KLONOPIN) 0.5 MG tablet Take 0.25 mg by mouth daily as needed for anxiety.   predniSONE (DELTASONE) 10 MG tablet Take 1 tablet (10 mg total) by mouth daily with breakfast.   propranolol (INDERAL) 10 MG tablet Take 1 tablet (10 mg total) by mouth 3 (three) times daily.   [DISCONTINUED] predniSONE (DELTASONE) 10 MG tablet Take 1 tablet (10 mg total) by mouth daily with breakfast. (Patient not taking: Reported on 01/08/2022)    [DISCONTINUED] propranolol (INDERAL) 10 MG tablet Take 1 tablet (10 mg total) by mouth 2 (two) times daily. (Patient taking differently: Take 10 mg by mouth 3 (three) times daily.)   No facility-administered encounter medications on file as of 01/08/2022.    ALLERGIES: No Known Allergies  VACCINATION STATUS:  There is no immunization history on file for this patient.   HPI  Brandon Chase is 41 y.o. male who presents today, accompanied by his wife, with a medical history as above. he was was given radioactive iodine treatment for hyperthyroidism from Graves' disease.  He received this intervention on November 12, 2021.  His previsit labs, 2 months after his treatment, remain consistent with significant hormone burden and patient has been symptomatic.  He had to visit ER for palpitations the beginning of this week. Patient continues to have symptoms including anxiety, fatigue.   He was recently worked up and ruled out for cardiac etiologies. He is currently on propranolol 10 mg p.o. 3 times a day.    he denies dysphagia, choking, shortness of breath, no recent voice change.    he does have family history of thyroid dysfunction in his mother and grandparents (thinks hypothyroidism), but denies family hx of thyroid cancer. he denies personal history of  goiter.  Denies use of Biotin containing supplements.  he is willing to proceed with appropriate work up and therapy for thyrotoxicosis.  He does note improvement in his symptoms since starting the Methimazole.  Review of systems  Constitutional: + Regained some of the weight he lost, + fatigue.  Body mass index is 30.14 kg/m., no fatigue, no subjective hyperthermia, no subjective hypothermia.  Eyes: no blurry vision, no xerophthalmia ENT: no sore throat, no nodules palpated in throat, no dysphagia/odynophagia, no hoarseness Cardiovascular: no chest pain, no shortness of breath, - palpitations, no leg swelling Respiratory: no cough, no  shortness of breath Gastrointestinal: no nausea/vomiting/diarrhea Musculoskeletal: no muscle/joint aches Skin: no rashes, no hyperemia Neurological: + tremors, no numbness, no tingling, no dizziness Psychiatric: no depression, + anxiety   Objective:    BP 106/68   Pulse 72   Ht 5\' 4"  (1.626 m)   Wt 175 lb 9.6 oz (79.7 kg)   BMI 30.14 kg/m   Wt Readings from Last 3 Encounters:  01/08/22 175 lb 9.6 oz (79.7 kg)  11/26/21 179 lb 3.2 oz (81.3 kg)  11/10/21 176 lb 3.2 oz (79.9 kg)     BP Readings from Last 3 Encounters:  01/08/22 106/68  11/26/21 104/62  11/10/21 100/72            Physical Exam- Limited  Constitutional:  Body mass index is 30.14 kg/m. , not in acute distress, normal state of mind Eyes:  EOMI, no exophthalmos Neck: Supple Thyroid: No gross goiter, ? mildly enlarged, mildly tender right lobe  thyroid.      CMP     Component Value Date/Time   NA 140 09/25/2021 0332   K 3.6 09/25/2021 0332   CL 109 09/25/2021 0332   CO2 26 09/25/2021 0332   GLUCOSE 107 (H) 09/25/2021 0332   BUN 11 09/25/2021 0332   CREATININE 0.94 09/25/2021 0332   CALCIUM 8.7 (L) 09/25/2021 0332   PROT 5.4 (L) 09/25/2021 0332   ALBUMIN 3.1 (L) 09/25/2021 0332   AST 20 09/25/2021 0332   ALT 31 09/25/2021 0332   ALKPHOS 41 09/25/2021 0332   BILITOT 0.4 09/25/2021 0332   GFRNONAA >60 09/25/2021 0332   GFRAA >90 02/02/2012 1752     CBC    Component Value Date/Time   WBC 4.4 09/25/2021 0332   RBC 4.58 09/25/2021 0332   HGB 13.5 09/25/2021 0332   HCT 39.7 09/25/2021 0332   PLT 234 09/25/2021 0332   MCV 86.7 09/25/2021 0332   MCH 29.5 09/25/2021 0332   MCHC 34.0 09/25/2021 0332   RDW 12.3 09/25/2021 0332   LYMPHSABS 1.7 02/02/2012 1752   MONOABS 0.5 02/02/2012 1752   EOSABS 0.0 02/02/2012 1752   BASOSABS 0.1 02/02/2012 1752     Lab Results  Component Value Date   TSH <0.005 (L) 01/06/2022   TSH 0.008 (L) 10/20/2021   TSH <0.010 (L) 09/24/2021   TSH <0.005 (L)  09/15/2021   TSH <0.005 (L) 08/28/2021   TSH 0.01 (A) 08/15/2021   FREET4 2.64 (H) 01/06/2022   FREET4 1.66 10/20/2021   FREET4 1.17 (H) 09/24/2021   FREET4 1.77 09/15/2021   FREET4 2.23 (H) 08/28/2021     November 06, 2021, thyroid uptake and scan results:  FINDINGS: Homogeneous tracer distribution in both thyroid lobes.  No focal areas of increased or decreased tracer localization.   4 hour I-123 uptake = 34.1% (normal 5-20%),  24 hour I-123 uptake = 45.7% (normal 10-30%)   IMPRESSION: Normal thyroid  scan with elevated 4 hour and 24 hour radio iodine uptakes.  Findings are consistent with Graves disease.  November 12, 2021 RADIOPHARMACEUTICALS:  21.6 mCi I-131 sodium iodide orally   IMPRESSION: Per oral administration of I-131 sodium iodide for the treatment of hyperthyroidism.    Assessment & Plan:   1. Hyperthyroidism- 2.  Graves' disease He is status post RAI on November 12, 2021.  Nearly 2 months after therapy, his labs are showing persistent thyroid hormone burden, patient is symptomatic.  This is likely treatment failure.  I discussed his options with him including repeat treatment with RAI.   He agrees with this approach.  I will order this treatment to be administered in [facility] nuclear medicine.  In the meantime he is advised to resume prednisone 10 mg p.o. daily for the next 15 days, propranolol 10 mg p.o. 3 times daily.   Given his fluctuating energy level, he was offered adrenal assessment, a.m. cortisol is normal.   -Patient is made aware of the high likelihood of post ablative hypothyroidism with subsequent need for lifelong thyroid hormone replacement.  If he does not respond to second dose, he will be considered for stabilization with ATD followed by surgery for thyroidectomy.  -Patient is advised to maintain close follow up with Suzan Slick, MD for primary care needs.   I spent 22 minutes in the care of the patient today including review of labs from  Thyroid Function, CMP, and other relevant labs ; imaging/biopsy records (current and previous including abstractions from other facilities); face-to-face time discussing  his lab results and symptoms, medications doses, his options of short and long term treatment based on the latest standards of care / guidelines;   and documenting the encounter.  Brandon Chase  participated in the discussions, expressed understanding, and voiced agreement with the above plans.  All questions were answered to his satisfaction. he is encouraged to contact clinic should he have any questions or concerns prior to his return visit.   Follow up plan: Return in about 9 weeks (around 03/12/2022) for F/U with Labs after I131 Therapy.   Thank you for involving me in the care of this pleasant patient, and I will continue to update you with his progress.   Marquis Lunch, MD [facility] [address] [place], [place] [address] Phone: [phone] Fax: [phone]  01/08/2022, 5:45 PM

## 2022-01-12 ENCOUNTER — Other Ambulatory Visit: Payer: Self-pay | Admitting: "Endocrinology

## 2022-01-12 DIAGNOSIS — E0590 Thyrotoxicosis, unspecified without thyrotoxic crisis or storm: Secondary | ICD-10-CM

## 2022-01-13 ENCOUNTER — Ambulatory Visit: Payer: 59 | Admitting: "Endocrinology

## 2022-01-13 ENCOUNTER — Other Ambulatory Visit: Payer: Self-pay | Admitting: "Endocrinology

## 2022-01-13 DIAGNOSIS — E0590 Thyrotoxicosis, unspecified without thyrotoxic crisis or storm: Secondary | ICD-10-CM

## 2022-01-14 ENCOUNTER — Ambulatory Visit: Payer: 59 | Admitting: "Endocrinology

## 2022-01-15 ENCOUNTER — Encounter (HOSPITAL_COMMUNITY): Payer: Self-pay

## 2022-01-15 ENCOUNTER — Ambulatory Visit (HOSPITAL_COMMUNITY)
Admission: RE | Admit: 2022-01-15 | Discharge: 2022-01-15 | Disposition: A | Discharge status: Home / Self Care | Payer: 59 | Source: Ambulatory Visit | Attending: "Endocrinology | Admitting: "Endocrinology

## 2022-01-15 DIAGNOSIS — E0590 Thyrotoxicosis, unspecified without thyrotoxic crisis or storm: Secondary | ICD-10-CM | POA: Insufficient documentation

## 2022-01-15 MED ORDER — SODIUM IODIDE I-123 7.4 MBQ CAPS
200.0000 | ORAL_CAPSULE | Freq: Once | ORAL | Status: AC
  Administered 2022-01-15: 146 via ORAL

## 2022-01-16 ENCOUNTER — Telehealth: Payer: Self-pay | Admitting: "Endocrinology

## 2022-01-16 ENCOUNTER — Encounter (HOSPITAL_COMMUNITY)
Admission: RE | Admit: 2022-01-16 | Discharge: 2022-01-16 | Disposition: A | Discharge status: Home / Self Care | Payer: 59 | Source: Ambulatory Visit | Attending: "Endocrinology | Admitting: "Endocrinology

## 2022-01-16 NOTE — Telephone Encounter (Signed)
RAI not needed per NM uptake results

## 2022-01-19 ENCOUNTER — Telehealth: Payer: Self-pay | Admitting: "Endocrinology

## 2022-01-19 ENCOUNTER — Other Ambulatory Visit: Payer: Self-pay | Admitting: "Endocrinology

## 2022-01-19 ENCOUNTER — Encounter (HOSPITAL_COMMUNITY): Payer: 59

## 2022-01-19 MED ORDER — METHIMAZOLE 10 MG PO TABS: 10.0000 mg | ORAL_TABLET | Freq: Every day | ORAL | 1 refills | Status: DC

## 2022-01-19 NOTE — Telephone Encounter (Signed)
RAI does not need to be performed per the uptake. Pt is requesting Dr Fransico Him call him.  [phone]

## 2022-02-07 LAB — TSH: TSH: 0.014 u[IU]/mL — ABNORMAL LOW (ref 0.450–4.500)

## 2022-02-07 LAB — T4, FREE: Free T4: 1.41 ng/dL (ref 0.82–1.77)

## 2022-02-07 LAB — T3, FREE: T3, Free: 3.7 pg/mL (ref 2.0–4.4)

## 2022-02-12 ENCOUNTER — Ambulatory Visit: Payer: 59 | Admitting: "Endocrinology

## 2022-02-12 ENCOUNTER — Encounter: Payer: Self-pay | Admitting: "Endocrinology

## 2022-02-12 VITALS — BP 104/78 | HR 80 | Ht 64.0 in | Wt 177.2 lb

## 2022-02-12 DIAGNOSIS — E0590 Thyrotoxicosis, unspecified without thyrotoxic crisis or storm: Secondary | ICD-10-CM

## 2022-02-12 DIAGNOSIS — E0500 Thyrotoxicosis with diffuse goiter without thyrotoxic crisis or storm: Secondary | ICD-10-CM | POA: Diagnosis not present

## 2022-02-12 MED ORDER — METHIMAZOLE 5 MG PO TABS: 10.0000 mg | ORAL_TABLET | Freq: Every day | ORAL | 1 refills | Status: DC

## 2022-02-12 MED ORDER — PROPRANOLOL HCL 10 MG PO TABS: 10.0000 mg | ORAL_TABLET | Freq: Two times a day (BID) | ORAL | 2 refills | Status: DC

## 2022-02-12 NOTE — Progress Notes (Signed)
02/12/2022    Endocrinology follow-up note   Subjective:    Patient ID: Brandon Chase, male    DOB: 1980-12-11, PCP Suzan Slick, MD.   Past Medical History:  Diagnosis Date   Palpitations     Past Surgical History:  Procedure Laterality Date   CYST REMOVAL TRUNK     Also had a cyst removed from right side of cheek   Cyst removed  06/01/2004    Social History   Socioeconomic History   Marital status: Married    Spouse name: Not on file   Number of children: 2   Years of education: Not on file   Highest education level: Not on file  Occupational History   Occupation: Personnel officer    Comment: Company secretary  Tobacco Use   Smoking status: Never   Smokeless tobacco: Never  Vaping Use   Vaping Use: Never used  Substance and Sexual Activity   Alcohol use: No   Drug use: No   Sexual activity: Not on file  Other Topics Concern   Not on file  Social History Narrative   Not on file   Social Determinants of Health   Financial Resource Strain: Not on file  Food Insecurity: Not on file  Transportation Needs: Not on file  Physical Activity: Not on file  Stress: Not on file  Social Connections: Not on file    Family History  Problem Relation Age of Onset   Thyroid disease Mother     Outpatient Encounter Medications as of 02/12/2022  Medication Sig   acetaminophen (TYLENOL) 325 MG tablet Take 2 tablets (650 mg total) by mouth every 6 (six) hours as needed for mild pain or moderate pain (or Fever >/= 101).   clonazePAM (KLONOPIN) 0.5 MG tablet Take 0.25 mg by mouth daily as needed for anxiety.   methimazole (TAPAZOLE) 5 MG tablet Take 2 tablets (10 mg total) by mouth daily.   propranolol (INDERAL) 10 MG tablet Take 1 tablet (10 mg total) by mouth 2 (two) times daily.   [DISCONTINUED] methimazole (TAPAZOLE) 10 MG tablet Take 1 tablet (10 mg total) by mouth daily.   [DISCONTINUED] predniSONE (DELTASONE) 10 MG tablet Take 1 tablet (10 mg total) by  mouth daily with breakfast. (Patient not taking: Reported on 02/12/2022)   [DISCONTINUED] propranolol (INDERAL) 10 MG tablet Take 1 tablet (10 mg total) by mouth 3 (three) times daily. (Patient taking differently: Take 10 mg by mouth 3 (three) times daily. 10mg  in the morning, 5mg  in the afternoon and 10mg  at night)   No facility-administered encounter medications on file as of 02/12/2022.    ALLERGIES: No Known Allergies  VACCINATION STATUS:  There is no immunization history on file for this patient.   HPI  Brandon Chase is 41 y.o. male who presents today, accompanied by his wife, with a medical history as above. he was was given radioactive iodine treatment for hyperthyroidism from Graves' disease.  He received this intervention on November 12, 2021.  His previsit labs, 2 months after his treatment, was rather worsening and was being considered for repeat treatment.  However his repeat uptake and scan was low and his repeat treatment with I-131 was canceled.    He was however reinitiated on methimazole 10 mg p.o. daily.  He returns with significant improvement in his thyroid function profile and clinical improvement.  He still reports intermittent palpitations, and fatigue.  He remains on beta-blockers along with his methimazole.  He was recently worked up and ruled out for cardiac etiologies.  His EKG and echo were unremarkable.     he denies dysphagia, choking, shortness of breath, no recent voice change.    he does have family history of thyroid dysfunction in his mother and grandparents (thinks hypothyroidism), but denies family hx of thyroid cancer. he denies personal history of goiter.  Denies use of Biotin containing supplements.    Review of systems Steady weight since last visit. Constitutional:   Body mass index is 30.42 kg/m., no fatigue, no subjective hyperthermia, no subjective hypothermia.  Eyes: no blurry vision, no xerophthalmia ENT: no sore throat, no nodules  palpated in throat, no dysphagia/odynophagia, no hoarseness Cardiovascular: no chest pain, no shortness of breath, - palpitations, no leg swelling    Objective:    BP 104/78   Pulse 80   Ht 5\' 4"  (1.626 m)   Wt 177 lb 3.2 oz (80.4 kg)   BMI 30.42 kg/m   Wt Readings from Last 3 Encounters:  02/12/22 177 lb 3.2 oz (80.4 kg)  01/08/22 175 lb 9.6 oz (79.7 kg)  11/26/21 179 lb 3.2 oz (81.3 kg)     BP Readings from Last 3 Encounters:  02/12/22 104/78  01/08/22 106/68  11/26/21 104/62            Physical Exam- Limited  Constitutional:  Body mass index is 30.42 kg/m. , not in acute distress, normal state of mind Eyes:  EOMI, no exophthalmos Neck: Supple Thyroid: No gross goiter, ? mildly enlarged, mildly tender right lobe  thyroid.      CMP     Component Value Date/Time   NA 140 09/25/2021 0332   K 3.6 09/25/2021 0332   CL 109 09/25/2021 0332   CO2 26 09/25/2021 0332   GLUCOSE 107 (H) 09/25/2021 0332   BUN 11 09/25/2021 0332   CREATININE 0.94 09/25/2021 0332   CALCIUM 8.7 (L) 09/25/2021 0332   PROT 5.4 (L) 09/25/2021 0332   ALBUMIN 3.1 (L) 09/25/2021 0332   AST 20 09/25/2021 0332   ALT 31 09/25/2021 0332   ALKPHOS 41 09/25/2021 0332   BILITOT 0.4 09/25/2021 0332   GFRNONAA >60 09/25/2021 0332   GFRAA >90 02/02/2012 1752     CBC    Component Value Date/Time   WBC 4.4 09/25/2021 0332   RBC 4.58 09/25/2021 0332   HGB 13.5 09/25/2021 0332   HCT 39.7 09/25/2021 0332   PLT 234 09/25/2021 0332   MCV 86.7 09/25/2021 0332   MCH 29.5 09/25/2021 0332   MCHC 34.0 09/25/2021 0332   RDW 12.3 09/25/2021 0332   LYMPHSABS 1.7 02/02/2012 1752   MONOABS 0.5 02/02/2012 1752   EOSABS 0.0 02/02/2012 1752   BASOSABS 0.1 02/02/2012 1752     Lab Results  Component Value Date   TSH 0.014 (L) 02/06/2022   TSH <0.005 (L) 01/06/2022   TSH 0.008 (L) 10/20/2021   TSH <0.010 (L) 09/24/2021   TSH <0.005 (L) 09/15/2021   TSH <0.005 (L) 08/28/2021   TSH 0.01 (A) 08/15/2021    FREET4 1.41 02/06/2022   FREET4 2.64 (H) 01/06/2022   FREET4 1.66 10/20/2021   FREET4 1.17 (H) 09/24/2021   FREET4 1.77 09/15/2021   FREET4 2.23 (H) 08/28/2021     November 06, 2021, thyroid uptake and scan results:  FINDINGS: Homogeneous tracer distribution in both thyroid lobes.  No focal areas of increased or decreased tracer localization.   4 hour I-123 uptake = 34.1% (normal 5-20%),  24 hour I-123 uptake = 45.7% (normal 10-30%)   IMPRESSION: Normal thyroid scan with elevated 4 hour and 24 hour radio iodine uptakes.  Findings are consistent with Graves disease.  November 12, 2021 RADIOPHARMACEUTICALS:  21.6 mCi I-131 sodium iodide orally   IMPRESSION: Per oral administration of I-131 sodium iodide for the treatment of hyperthyroidism.    Assessment & Plan:   1. Hyperthyroidism- 2.  Graves' disease He is status post RAI on November 12, 2021.  Due to inadequate response to I-131 during his last visit, he was considered for repeat uptake and scan which showed low uptake and decision was made not to retreat.  However he is responding to methimazole treatment with thyroid hormone is getting towards normal range.  He did not develop hypothyroidism yet.  He is advised to continue methimazole 10 mg p.o. daily with plan to check thyroid function test in 3 weeks.  If he is developing hypothyroidism, he will be taken off of methimazole and will be initiated on levothyroxine.  If he does not develop hypothyroidism, he will be continued on methimazole until next check in 7 weeks.   He is status posttreatment with prednisone, advised to lower his propanolol to 10 mg p.o. twice daily.  Given his fluctuating energy level, he was offered adrenal assessment, a.m. cortisol is normal.   -Patient is made aware of the high likelihood of post ablative hypothyroidism with subsequent need for lifelong thyroid hormone replacement.    -Patient is advised to maintain close follow up with Suzan Slick, MD for  primary care needs.   I spent  25 minutes in the care of the patient today including review of labs from Thyroid Function, CMP, and other relevant labs ; imaging/biopsy records (current and previous including abstractions from other facilities); face-to-face time discussing  his lab results and symptoms, medications doses, his options of short and long term treatment based on the latest standards of care / guidelines;   and documenting the encounter.  Brandon Chase  participated in the discussions, expressed understanding, and voiced agreement with the above plans.  All questions were answered to his satisfaction. he is encouraged to contact clinic should he have any questions or concerns prior to his return visit.   Follow up plan: Return in about 7 weeks (around 04/02/2022), or Labs in 3weeks, and before next visit in 6 weeks, for F/U with Pre-visit Labs.   Thank you for involving me in the care of this pleasant patient, and I will continue to update you with his progress.   Marquis Lunch, MD [facility] [address] [place], [place] [address] Phone: [phone] Fax: [phone]  02/12/2022, 7:46 PM

## 2022-03-06 LAB — T3, FREE: T3, Free: 1 pg/mL — ABNORMAL LOW (ref 2.0–4.4)

## 2022-03-06 LAB — T4, FREE: Free T4: 0.27 ng/dL — ABNORMAL LOW (ref 0.82–1.77)

## 2022-03-06 LAB — TSH: TSH: 39.3 u[IU]/mL — ABNORMAL HIGH (ref 0.450–4.500)

## 2022-03-09 ENCOUNTER — Other Ambulatory Visit: Payer: Self-pay | Admitting: "Endocrinology

## 2022-03-09 ENCOUNTER — Telehealth: Payer: Self-pay | Admitting: "Endocrinology

## 2022-03-09 DIAGNOSIS — E0590 Thyrotoxicosis, unspecified without thyrotoxic crisis or storm: Secondary | ICD-10-CM

## 2022-03-09 MED ORDER — LEVOTHYROXINE SODIUM 50 MCG PO TABS: 50.0000 ug | ORAL_TABLET | Freq: Every day | ORAL | 1 refills | Status: DC

## 2022-03-09 NOTE — Telephone Encounter (Signed)
New message    Patient done with radioactive treatment.   Patient seen test result - they are low.   Discuss long term medication

## 2022-03-09 NOTE — Telephone Encounter (Signed)
Discussed with pt, understanding voiced. Orders for labs sent to [facility].

## 2022-03-12 ENCOUNTER — Ambulatory Visit: Payer: 59 | Admitting: "Endocrinology

## 2022-04-10 ENCOUNTER — Telehealth: Payer: Self-pay | Admitting: "Endocrinology

## 2022-04-10 LAB — T4, FREE: Free T4: 0.59 ng/dL — ABNORMAL LOW (ref 0.82–1.77)

## 2022-04-10 LAB — T3, FREE: T3, Free: 1.5 pg/mL — ABNORMAL LOW (ref 2.0–4.4)

## 2022-04-10 LAB — TSH: TSH: 79.8 u[IU]/mL — ABNORMAL HIGH (ref 0.450–4.500)

## 2022-04-10 NOTE — Telephone Encounter (Signed)
Pt said he did his labs and see the results are double then what they were. He has not felt good the last few weeks. Please Advise.

## 2022-04-10 NOTE — Telephone Encounter (Signed)
Patient made aware- he will keep his appt

## 2022-04-16 ENCOUNTER — Encounter: Payer: Self-pay | Admitting: "Endocrinology

## 2022-04-16 ENCOUNTER — Ambulatory Visit: Payer: 59 | Admitting: "Endocrinology

## 2022-04-16 VITALS — BP 108/83 | HR 80 | Ht 64.0 in | Wt 183.8 lb

## 2022-04-16 DIAGNOSIS — E890 Postprocedural hypothyroidism: Secondary | ICD-10-CM

## 2022-04-16 MED ORDER — LEVOTHYROXINE SODIUM 112 MCG PO TABS: 112.0000 ug | ORAL_TABLET | Freq: Every day | ORAL | 1 refills | Status: DC

## 2022-04-16 NOTE — Progress Notes (Signed)
04/16/2022    Endocrinology follow-up note   Subjective:    Patient ID: Brandon Chase, male    DOB: Feb 28, 1981, PCP Leeanne Rio, MD.   Past Medical History:  Diagnosis Date   Palpitations     Past Surgical History:  Procedure Laterality Date   CYST REMOVAL TRUNK     Also had a cyst removed from right side of cheek   Cyst removed  06/01/2004    Social History   Socioeconomic History   Marital status: Married    Spouse name: Not on file   Number of children: 2   Years of education: Not on file   Highest education level: Not on file  Occupational History   Occupation: Clinical biochemist    Comment: Licensed conveyancer  Tobacco Use   Smoking status: Never   Smokeless tobacco: Never  Vaping Use   Vaping Use: Never used  Substance and Sexual Activity   Alcohol use: No   Drug use: No   Sexual activity: Not on file  Other Topics Concern   Not on file  Social History Narrative   Not on file   Social Determinants of Health   Financial Resource Strain: Not on file  Food Insecurity: Not on file  Transportation Needs: Not on file  Physical Activity: Not on file  Stress: Not on file  Social Connections: Not on file    Family History  Problem Relation Age of Onset   Thyroid disease Mother     Outpatient Encounter Medications as of 04/16/2022  Medication Sig   [DISCONTINUED] levothyroxine (SYNTHROID) 100 MCG tablet Take 100 mcg by mouth daily before breakfast.   acetaminophen (TYLENOL) 325 MG tablet Take 2 tablets (650 mg total) by mouth every 6 (six) hours as needed for mild pain or moderate pain (or Fever >/= 101). (Patient not taking: Reported on 04/16/2022)   clonazePAM (KLONOPIN) 0.5 MG tablet Take 0.25 mg by mouth daily as needed for anxiety.   levothyroxine (SYNTHROID) 112 MCG tablet Take 1 tablet (112 mcg total) by mouth daily before breakfast.   propranolol (INDERAL) 10 MG tablet Take 1 tablet (10 mg total) by mouth 2 (two) times daily. (Patient  not taking: Reported on 04/16/2022)   [DISCONTINUED] levothyroxine (SYNTHROID) 50 MCG tablet Take 1 tablet (50 mcg total) by mouth daily.   No facility-administered encounter medications on file as of 04/16/2022.    ALLERGIES: No Known Allergies  VACCINATION STATUS:  There is no immunization history on file for this patient.   HPI  Brandon Chase is 41 y.o. male who presents today, accompanied by his wife, with a medical history as above. he was was given radioactive iodine treatment for hyperthyroidism from Graves' disease.  He received this intervention on November 12, 2021.  Since his last visit, his thyroid hormone dropped through hypothyroid range and was started on levothyroxine 50 mcg p.o. daily.  This dose was increased to 100 mcg recently.  Patient returns with symptoms of hypothyroidism including fatigue, cold intolerance.      he denies dysphagia, choking, shortness of breath, no recent voice change.    he does have family history of thyroid dysfunction in his mother and grandparents (thinks hypothyroidism), but denies family hx of thyroid cancer. he denies personal history of goiter.  Denies use of Biotin containing supplements.    Review of systems Steady weight since last visit. Constitutional:   Body mass index is 31.55 kg/m., no fatigue, no subjective hyperthermia, no subjective  hypothermia.  Eyes: no blurry vision, no xerophthalmia ENT: no sore throat, no nodules palpated in throat, no dysphagia/odynophagia, no hoarseness Cardiovascular: no chest pain, no shortness of breath, - palpitations, no leg swelling    Objective:    BP 108/83   Pulse 80   Ht 5\' 4"  (1.626 m)   Wt 183 lb 12.8 oz (83.4 kg)   BMI 31.55 kg/m   Wt Readings from Last 3 Encounters:  04/16/22 183 lb 12.8 oz (83.4 kg)  02/12/22 177 lb 3.2 oz (80.4 kg)  01/08/22 175 lb 9.6 oz (79.7 kg)     BP Readings from Last 3 Encounters:  04/16/22 108/83  02/12/22 104/78  01/08/22 106/68             Physical Exam- Limited  Constitutional:  Body mass index is 31.55 kg/m. , not in acute distress, normal state of mind Eyes:  EOMI, no exophthalmos Neck: Supple Thyroid: No gross goiter, ? mildly enlarged, mildly tender right lobe  thyroid.      CMP     Component Value Date/Time   NA 140 09/25/2021 0332   K 3.6 09/25/2021 0332   CL 109 09/25/2021 0332   CO2 26 09/25/2021 0332   GLUCOSE 107 (H) 09/25/2021 0332   BUN 11 09/25/2021 0332   CREATININE 0.94 09/25/2021 0332   CALCIUM 8.7 (L) 09/25/2021 0332   PROT 5.4 (L) 09/25/2021 0332   ALBUMIN 3.1 (L) 09/25/2021 0332   AST 20 09/25/2021 0332   ALT 31 09/25/2021 0332   ALKPHOS 41 09/25/2021 0332   BILITOT 0.4 09/25/2021 0332   GFRNONAA >60 09/25/2021 0332   GFRAA >90 02/02/2012 1752     CBC    Component Value Date/Time   WBC 4.4 09/25/2021 0332   RBC 4.58 09/25/2021 0332   HGB 13.5 09/25/2021 0332   HCT 39.7 09/25/2021 0332   PLT 234 09/25/2021 0332   MCV 86.7 09/25/2021 0332   MCH 29.5 09/25/2021 0332   MCHC 34.0 09/25/2021 0332   RDW 12.3 09/25/2021 0332   LYMPHSABS 1.7 02/02/2012 1752   MONOABS 0.5 02/02/2012 1752   EOSABS 0.0 02/02/2012 1752   BASOSABS 0.1 02/02/2012 1752     Lab Results  Component Value Date   TSH 79.800 (H) 04/09/2022   TSH 39.300 (H) 03/05/2022   TSH 0.014 (L) 02/06/2022   TSH <0.005 (L) 01/06/2022   TSH 0.008 (L) 10/20/2021   TSH <0.010 (L) 09/24/2021   TSH <0.005 (L) 09/15/2021   TSH <0.005 (L) 08/28/2021   TSH 0.01 (A) 08/15/2021   FREET4 0.59 (L) 04/09/2022   FREET4 0.27 (L) 03/05/2022   FREET4 1.41 02/06/2022   FREET4 2.64 (H) 01/06/2022   FREET4 1.66 10/20/2021   FREET4 1.17 (H) 09/24/2021   FREET4 1.77 09/15/2021   FREET4 2.23 (H) 08/28/2021     November 06, 2021, thyroid uptake and scan results:  FINDINGS: Homogeneous tracer distribution in both thyroid lobes.  No focal areas of increased or decreased tracer localization.   4 hour I-123 uptake = 34.1% (normal  5-20%),  24 hour I-123 uptake = 45.7% (normal 10-30%)   IMPRESSION: Normal thyroid scan with elevated 4 hour and 24 hour radio iodine uptakes.  Findings are consistent with Graves disease.  November 12, 2021 RADIOPHARMACEUTICALS:  21.6 mCi I-131 sodium iodide orally   IMPRESSION: Per oral administration of I-131 sodium iodide for the treatment of hyperthyroidism.    Assessment & Plan:   1.  RAI induced hypothyroidism  2.  Graves' disease-resolved He is status post RAI on November 12, 2021.  He was started on levothyroxine in the interval.  He is previsit labs are still consistent with under replacement.  I discussed and increased his levothyroxine to 112 mcg p.o. daily before breakfast.   - We discussed about the correct intake of his thyroid hormone, on empty stomach at fasting, with water, separated by at least 30 minutes from breakfast and other medications,  and separated by more than 4 hours from calcium, iron, multivitamins, acid reflux medications (PPIs). -Patient is made aware of the fact that thyroid hormone replacement is needed for life, dose to be adjusted by periodic monitoring of thyroid function tests.  -Patient is advised to maintain close follow up with Suzan Slick, MD for primary care needs.   I spent 21 minutes in the care of the patient today including review of labs from Thyroid Function, CMP, and other relevant labs ; imaging/biopsy records (current and previous including abstractions from other facilities); face-to-face time discussing  his lab results and symptoms, medications doses, his options of short and long term treatment based on the latest standards of care / guidelines;   and documenting the encounter.  Maxwell Marion  participated in the discussions, expressed understanding, and voiced agreement with the above plans.  All questions were answered to his satisfaction. he is encouraged to contact clinic should he have any questions or concerns prior to his  return visit.   Follow up plan: Return in about 3 months (around 07/17/2022) for Fasting Labs  in AM B4 8.   Thank you for involving me in the care of this pleasant patient, and I will continue to update you with his progress.   Marquis Lunch, MD [facility] [address] [place], [place] [address] Phone: [phone] Fax: [phone]  04/16/2022, 4:23 PM

## 2022-04-26 ENCOUNTER — Other Ambulatory Visit: Payer: Self-pay | Admitting: "Endocrinology

## 2022-05-05 ENCOUNTER — Telehealth: Payer: Self-pay | Admitting: "Endocrinology

## 2022-05-05 NOTE — Telephone Encounter (Signed)
Pt left a Voicemail stating he is having tingling in his hands, numbness, and nausea. He asked if these are symptoms he should be having since you seen him and had a dose increase. Please advise.

## 2022-05-06 NOTE — Telephone Encounter (Signed)
Left a message requesting pt return call to the office. ?

## 2022-05-07 NOTE — Telephone Encounter (Signed)
Spoke with pt, he stated he is not taking propranolol. Suggested he contact his PCP, he stated he would contact their office.

## 2022-05-14 ENCOUNTER — Emergency Department (HOSPITAL_BASED_OUTPATIENT_CLINIC_OR_DEPARTMENT_OTHER)
Admission: EM | Admit: 2022-05-14 | Discharge: 2022-05-14 | Disposition: A | Discharge status: Home / Self Care | Payer: 59 | Attending: Emergency Medicine | Admitting: Emergency Medicine

## 2022-05-14 ENCOUNTER — Other Ambulatory Visit: Payer: Self-pay

## 2022-05-14 ENCOUNTER — Encounter (HOSPITAL_BASED_OUTPATIENT_CLINIC_OR_DEPARTMENT_OTHER): Payer: Self-pay

## 2022-05-14 ENCOUNTER — Emergency Department (HOSPITAL_BASED_OUTPATIENT_CLINIC_OR_DEPARTMENT_OTHER): Payer: 59 | Admitting: Radiology

## 2022-05-14 DIAGNOSIS — E039 Hypothyroidism, unspecified: Secondary | ICD-10-CM | POA: Diagnosis not present

## 2022-05-14 DIAGNOSIS — R202 Paresthesia of skin: Secondary | ICD-10-CM | POA: Insufficient documentation

## 2022-05-14 DIAGNOSIS — Z79899 Other long term (current) drug therapy: Secondary | ICD-10-CM | POA: Diagnosis not present

## 2022-05-14 DIAGNOSIS — F419 Anxiety disorder, unspecified: Secondary | ICD-10-CM | POA: Diagnosis not present

## 2022-05-14 DIAGNOSIS — R0789 Other chest pain: Secondary | ICD-10-CM | POA: Diagnosis present

## 2022-05-14 LAB — TROPONIN I (HIGH SENSITIVITY)
Troponin I (High Sensitivity): <2 ng/L (ref <18)
Troponin I (High Sensitivity): <2 ng/L (ref <18)

## 2022-05-14 LAB — BASIC METABOLIC PANEL
Anion gap: 12 (ref 5–15)
BUN: 10 mg/dL (ref 6–20)
CO2: 27 mmol/L (ref 22–32)
Calcium: 10.5 mg/dL — ABNORMAL HIGH (ref 8.9–10.3)
Chloride: 99 mmol/L (ref 98–111)
Creatinine, Ser: 1.03 mg/dL (ref 0.61–1.24)
GFR, Estimated: >60 mL/min (ref >60)
Glucose, Bld: 99 mg/dL (ref 70–99)
Potassium: 4 mmol/L (ref 3.5–5.1)
Sodium: 138 mmol/L (ref 135–145)

## 2022-05-14 LAB — CBC
HCT: 43.4 % (ref 39.0–52.0)
Hemoglobin: 15 g/dL (ref 13.0–17.0)
MCH: 31 pg (ref 26.0–34.0)
MCHC: 34.6 g/dL (ref 30.0–36.0)
MCV: 89.7 fL (ref 80.0–100.0)
Platelets: 239 10*3/uL (ref 150–400)
RBC: 4.84 MIL/uL (ref 4.22–5.81)
RDW: 13.2 % (ref 11.5–15.5)
WBC: 3.8 10*3/uL — ABNORMAL LOW (ref 4.0–10.5)
nRBC: 0 % (ref 0.0–0.2)

## 2022-05-14 MED ORDER — LORAZEPAM 1 MG PO TABS
1.0000 mg | ORAL_TABLET | Freq: Once | ORAL | Status: AC
  Administered 2022-05-14: 1 mg via ORAL
  Filled 2022-05-14: qty 1

## 2022-05-14 MED ORDER — HYDROXYZINE HCL 25 MG PO TABS: 25.0000 mg | ORAL_TABLET | Freq: Four times a day (QID) | ORAL | 0 refills | Status: DC | PRN

## 2022-05-14 MED ORDER — HYDROXYZINE HCL 25 MG PO TABS: 25.0000 mg | ORAL_TABLET | Freq: Four times a day (QID) | ORAL | 0 refills | Status: DC

## 2022-05-14 NOTE — ED Triage Notes (Signed)
Patient here POV from Home.  Endorses Chest Tightness that began a few days ago. Associated with Bilateral Arm Numbness. Some SOB associated as well. Consistent Tightness.   NAD Noted during Triage. A&Ox4. GCS 15. Ambulatory.

## 2022-05-14 NOTE — Discharge Instructions (Signed)
Please keep your doctors appointment for the next 2 weeks.  I have given you medication called hydroxyzine which can help with some of the symptoms that you are having as I do feel that this is likely attributed to anxiety.  As we discussed, your heart enzymes were normal today which is great news.  You may return to the emergency department for any worsening symptoms you might have, otherwise follow-up with your primary care doctor.

## 2022-05-14 NOTE — ED Provider Notes (Signed)
[facility] EMERGENCY DEPT Provider Note   CSN: [id number] Arrival date & time: 05/14/22  1248     History Chief Complaint  Patient presents with   Chest Pain    Brandon Chase is a 41 y.o. male patient patient with hyperactive thyroid requiring radio iodine therapy now on levothyroxine for hypothyroidism who presents to the emergency department today for chest tightness which has been constant for the last week.  He also states that he had 1 episode of hypotension earlier this week at work which made him feel "terrible."  He states that he is also having some tingling and pins-and-needles in his left hand with the chest tightness.  He denies any shortness of breath.  He also reports associated anxious feeling.  Patient did see his primary care doctor for normal evaluation and had blood work.  I personally reviewed this and his TSH was elevated but his free T4 and free T3 were both in normal limits.  Rest of his labs were also reassuring.   Chest Pain      Home Medications Prior to Admission medications   Medication Sig Start Date End Date Taking? Authorizing Provider  acetaminophen (TYLENOL) 325 MG tablet Take 2 tablets (650 mg total) by mouth every 6 (six) hours as needed for mild pain or moderate pain (or Fever >/= 101). Patient not taking: Reported on 04/16/2022 09/25/21   Rhetta Mura, MD  clonazePAM (KLONOPIN) 0.5 MG tablet Take 0.25 mg by mouth daily as needed for anxiety. 09/02/18   [provider]  hydrOXYzine (ATARAX) 25 MG tablet Take 1 tablet (25 mg total) by mouth every 6 (six) hours as needed. 05/14/22   Teressa Lower, PA-C  levothyroxine (SYNTHROID) 112 MCG tablet Take 1 tablet (112 mcg total) by mouth daily before breakfast. 04/16/22   Nida, Denman George, MD  levothyroxine (SYNTHROID) 112 MCG tablet Take 1 tablet (112 mcg total) by mouth daily. 04/29/22   Roma Kayser, MD      Allergies    Patient has no known allergies.     Review of Systems   Review of Systems  Cardiovascular:  Positive for chest pain.  All other systems reviewed and are negative.   Physical Exam Updated Vital Signs BP 111/74   Pulse 74   Temp 98.5 F (36.9 C) (Oral)   Resp (!) 24   Ht 5\' 4"  (1.626 m)   Wt 83.4 kg   SpO2 100%   BMI 31.56 kg/m  Physical Exam Vitals and nursing note reviewed.  Constitutional:      General: He is not in acute distress.    Appearance: Normal appearance.  HENT:     Head: Normocephalic and atraumatic.  Eyes:     General:        Right eye: No discharge.        Left eye: No discharge.  Cardiovascular:     Comments: Regular rate and rhythm.  S1/S2 are distinct without any evidence of murmur, rubs, or gallops.  Radial pulses are 2+ bilaterally.  Dorsalis pedis pulses are 2+ bilaterally.  No evidence of pedal edema. Pulmonary:     Comments: Clear to auscultation bilaterally.  Normal effort.  No respiratory distress.  No evidence of wheezes, rales, or rhonchi heard throughout. Abdominal:     General: Abdomen is flat. Bowel sounds are normal. There is no distension.     Tenderness: There is no abdominal tenderness. There is no guarding or rebound.  Musculoskeletal:  General: Normal range of motion.     Cervical back: Neck supple.  Skin:    General: Skin is warm and dry.     Findings: No rash.  Neurological:     General: No focal deficit present.     Mental Status: He is alert.  Psychiatric:        Mood and Affect: Mood normal.        Behavior: Behavior normal.     ED Results / Procedures / Treatments   Labs (all labs ordered are listed, but only abnormal results are displayed) Labs Reviewed  BASIC METABOLIC PANEL - Abnormal; Notable for the following components:      Result Value   Calcium 10.5 (*)    All other components within normal limits  CBC - Abnormal; Notable for the following components:   WBC 3.8 (*)    All other components within normal limits  TROPONIN I (HIGH  SENSITIVITY)  TROPONIN I (HIGH SENSITIVITY)    EKG EKG Interpretation  Date/Time:  Thursday May 14 2022 12:54:12 EST Ventricular Rate:  77 PR Interval:  162 QRS Duration: 78 QT Interval:  354 QTC Calculation: 400 R Axis:   38 Text Interpretation: Normal sinus rhythm Normal ECG Confirmed by Cathren Laine ([id number]) on 05/14/2022 1:04:54 PM  Radiology DG Chest 2 View  Result Date: 05/14/2022 CLINICAL DATA:  Chest pain and shortness of breath. EXAM: CHEST - 2 VIEW COMPARISON:  09/24/2021 FINDINGS: The cardiac silhouette, mediastinal and hilar contours are normal. The lungs are clear. No pleural effusions. The bony thorax is intact. IMPRESSION: No acute cardiopulmonary findings. Electronically Signed   By: Rudie Meyer M.D.   On: 05/14/2022 13:33    Procedures Procedures    Medications Ordered in ED Medications  LORazepam (ATIVAN) tablet 1 mg (1 mg Oral Given 05/14/22 1559)    ED Course/ Medical Decision Making/ A&P Clinical Course as of 05/14/22 1831  Thu May 14, 2022  1556 CBC(!) There is evidence of leukopenia.  No other abnormalities. [CF]  1556 Basic metabolic panel(!) Normal. [CF]  1556 Troponin I (High Sensitivity) Initial troponin is normal. [CF]  1556 ED EKG EKG is normal.  No signs of ST elevation or depression. [CF]  1556 DG Chest 2 View I personally ordered and interpreted this study.  I do not see any evidence of pneumonia or pneumothorax.  I do agree with radiologist interpretation. [CF]  1825 On reevaluation, patient's chest tightness has improved after Ativan.  I went over the rest of his labs and imaging with him at the bedside.  We also discussed this episode of hypotension that he had several days ago.  Patient does not drink a lot of water and I encouraged him to increase his p.o. water intake. [CF]  1826 Troponin I (High Sensitivity) Delta Trop is negative. [CF]    Clinical Course User Index [CF] Teressa Lower, PA-C           HEART Score: 0                 Medical Decision Making Brandon Chase is a 41 y.o. male patient who presents to the emergency department today for further evaluation of chest tightness.  This has been ongoing for several days and I have a low suspicion for ACS at this time.  His EKG initially has been normal.  Regardless, we will get troponins x 2, basic labs, and chest x-ray.  I will also consider trying some Ativan for him  as I do feel that this could be attributed to anxiety.  He is in no acute distress at this time.   Amount and/or Complexity of Data Reviewed External Data Reviewed: labs and radiology.    Details: See HPI.  Patient also had a stress test which was normal. Labs: ordered. Decision-making details documented in ED Course.    Details: See ED course. Radiology: ordered. Decision-making details documented in ED Course. ECG/medicine tests:  Decision-making details documented in ED Course.  Risk Prescription drug management. Risk Details: Patient's troponins were negative x 2.  His chest tightness has improved.  At this time, he has a heart score of 0.  Does not meet inpatient criteria at this time.    Final Clinical Impression(s) / ED Diagnoses Final diagnoses:  Chest tightness    Rx / DC Orders ED Discharge Orders          Ordered    hydrOXYzine (ATARAX) 25 MG tablet  Every 6 hours,   Status:  Discontinued        05/14/22 1826    hydrOXYzine (ATARAX) 25 MG tablet  Every 6 hours PRN        05/14/22 1827              Teressa Lower, PA-C 05/14/22 1832    Glynn Octave, MD 05/14/22 2314

## 2022-05-14 NOTE — ED Notes (Signed)
Discharge instructions, follow up care, and prescriptions reviewed and explained, pt verbalized understanding.  

## 2022-05-21 ENCOUNTER — Telehealth: Payer: Self-pay

## 2022-05-21 NOTE — Telephone Encounter (Signed)
Pt called stating he's experiencing symptoms like he had prior to treatment of thyroid. Experiencing chest tightness, "extreme" fatigue, feeling of cloudiness, tingling, numbness & weakness in his L arm, staying cold and nausea at times. States he has seen his PCP but did not find contributing factors to symptoms.  He also has been seen at the ER once and states they did not find any cardiac problems. Pt had labs performed for PCP on 12/12 which included thyroid leves. TSH 27.250, T3,Free 2.82 and T4,Free 1.12. Pt also asked your opinion of if taking the brand name synthroid or something else would make a difference.

## 2022-05-22 ENCOUNTER — Other Ambulatory Visit: Payer: Self-pay | Admitting: "Endocrinology

## 2022-05-22 MED ORDER — SYNTHROID 125 MCG PO TABS: 125.0000 ug | ORAL_TABLET | Freq: Every day | ORAL | 2 refills | Status: DC

## 2022-05-22 NOTE — Telephone Encounter (Signed)
Discussed with pt, voiced understanding. Pt stated he would like to try Synthroid at the slightly higher dose.

## 2022-07-15 LAB — LIPID PANEL
Chol/HDL Ratio: 3.1 ratio (ref 0.0–5.0)
Cholesterol, Total: 212 mg/dL — ABNORMAL HIGH (ref 100–199)
HDL: 69 mg/dL (ref >39)
LDL Chol Calc (NIH): 131 mg/dL — ABNORMAL HIGH (ref 0–99)
Triglycerides: 66 mg/dL (ref 0–149)
VLDL Cholesterol Cal: 12 mg/dL (ref 5–40)

## 2022-07-15 LAB — TSH: TSH: 3.56 u[IU]/mL (ref 0.450–4.500)

## 2022-07-15 LAB — T4, FREE: Free T4: 1.49 ng/dL (ref 0.82–1.77)

## 2022-07-20 ENCOUNTER — Ambulatory Visit: Payer: 59 | Admitting: "Endocrinology

## 2022-07-20 ENCOUNTER — Encounter: Payer: Self-pay | Admitting: "Endocrinology

## 2022-07-20 VITALS — BP 102/68 | HR 80 | Ht 64.0 in | Wt 179.4 lb

## 2022-07-20 DIAGNOSIS — E782 Mixed hyperlipidemia: Secondary | ICD-10-CM

## 2022-07-20 DIAGNOSIS — E890 Postprocedural hypothyroidism: Secondary | ICD-10-CM | POA: Insufficient documentation

## 2022-07-20 MED ORDER — SYNTHROID 125 MCG PO TABS: 125.0000 ug | ORAL_TABLET | Freq: Every day | ORAL | 0 refills | Status: DC

## 2022-07-20 MED ORDER — SYNTHROID 125 MCG PO TABS: 125.0000 ug | ORAL_TABLET | Freq: Every day | ORAL | 1 refills | Status: DC

## 2022-07-20 NOTE — Progress Notes (Signed)
Pt experiencing "brain fog", fatigue x 1 wk, heart palpitations x 3 wks.

## 2022-07-20 NOTE — Progress Notes (Signed)
07/20/2022    Endocrinology follow-up note   Subjective:    Patient ID: Brandon Chase, male    DOB: 1980/08/27, PCP Pcp, No.   Past Medical History:  Diagnosis Date   Palpitations     Past Surgical History:  Procedure Laterality Date   CYST REMOVAL TRUNK     Also had a cyst removed from right side of cheek   Cyst removed  06/01/2004    Social History   Socioeconomic History   Marital status: Married    Spouse name: Not on file   Number of children: 2   Years of education: Not on file   Highest education level: Not on file  Occupational History   Occupation: Clinical biochemist    Comment: Licensed conveyancer  Tobacco Use   Smoking status: Never   Smokeless tobacco: Never  Vaping Use   Vaping Use: Never used  Substance and Sexual Activity   Alcohol use: No   Drug use: No   Sexual activity: Not on file  Other Topics Concern   Not on file  Social History Narrative   Not on file   Social Determinants of Health   Financial Resource Strain: Not on file  Food Insecurity: Not on file  Transportation Needs: Not on file  Physical Activity: Not on file  Stress: Not on file  Social Connections: Not on file    Family History  Problem Relation Age of Onset   Thyroid disease Mother     Outpatient Encounter Medications as of 07/20/2022  Medication Sig   Cholecalciferol (VITAMIN D3) 50 MCG (2000 UT) TABS Take 1 tablet by mouth daily.   SYNTHROID 125 MCG tablet Take 1 tablet (125 mcg total) by mouth daily before breakfast.   [DISCONTINUED] acetaminophen (TYLENOL) 325 MG tablet Take 2 tablets (650 mg total) by mouth every 6 (six) hours as needed for mild pain or moderate pain (or Fever >/= 101). (Patient not taking: Reported on 04/16/2022)   [DISCONTINUED] clonazePAM (KLONOPIN) 0.5 MG tablet Take 0.25 mg by mouth daily as needed for anxiety. (Patient not taking: Reported on 07/20/2022)   [DISCONTINUED] hydrOXYzine (ATARAX) 25 MG tablet Take 1 tablet (25 mg total) by  mouth every 6 (six) hours as needed. (Patient not taking: Reported on 07/20/2022)   [DISCONTINUED] SYNTHROID 125 MCG tablet Take 1 tablet (125 mcg total) by mouth daily before breakfast.   [DISCONTINUED] SYNTHROID 125 MCG tablet Take 1 tablet (125 mcg total) by mouth daily before breakfast.   No facility-administered encounter medications on file as of 07/20/2022.    ALLERGIES: No Known Allergies  VACCINATION STATUS:  There is no immunization history on file for this patient.   HPI  Brandon Chase is 42 y.o. male who presents today, accompanied by his wife, with a medical history as above. he was was given radioactive iodine treatment for hyperthyroidism from Graves' disease.  He received this intervention on November 12, 2021.  In the interval, he was switched to Synthroid 125 mcg p.o. daily before breakfast.  He comes with symptomatic improvement.  He has no new complaints today.      he denies dysphagia, choking, shortness of breath, no recent voice change.    he does have family history of thyroid dysfunction in his mother and grandparents (thinks hypothyroidism), but denies family hx of thyroid cancer. he denies personal history of goiter.  Denies use of Biotin containing supplements.    His previsit labs show significant dyslipidemia.  Review of systems  Steady weight since last visit. Constitutional:   Body mass index is 30.79 kg/m., no fatigue, no subjective hyperthermia, no subjective hypothermia.  Eyes: no blurry vision, no xerophthalmia ENT: no sore throat, no nodules palpated in throat, no dysphagia/odynophagia, no hoarseness Cardiovascular: no chest pain, no shortness of breath, - palpitations, no leg swelling   Objective:    BP 102/68   Pulse 80   Ht 5' 4"$  (1.626 m)   Wt 179 lb 6.4 oz (81.4 kg)   BMI 30.79 kg/m   Wt Readings from Last 3 Encounters:  07/20/22 179 lb 6.4 oz (81.4 kg)  05/14/22 183 lb 13.8 oz (83.4 kg)  04/16/22 183 lb 12.8 oz (83.4 kg)     BP  Readings from Last 3 Encounters:  07/20/22 102/68  05/14/22 111/74  04/16/22 108/83            Physical Exam- Limited  Constitutional:  Body mass index is 30.79 kg/m. , not in acute distress, normal state of mind Eyes:  EOMI, no exophthalmos Neck: Supple Thyroid: No gross goiter, ? mildly enlarged, mildly tender right lobe  thyroid.      CMP     Component Value Date/Time   NA 138 05/14/2022 1301   K 4.0 05/14/2022 1301   CL 99 05/14/2022 1301   CO2 27 05/14/2022 1301   GLUCOSE 99 05/14/2022 1301   BUN 10 05/14/2022 1301   CREATININE 1.03 05/14/2022 1301   CALCIUM 10.5 (H) 05/14/2022 1301   PROT 5.4 (L) 09/25/2021 0332   ALBUMIN 3.1 (L) 09/25/2021 0332   AST 20 09/25/2021 0332   ALT 31 09/25/2021 0332   ALKPHOS 41 09/25/2021 0332   BILITOT 0.4 09/25/2021 0332   GFRNONAA >60 05/14/2022 1301   GFRAA >90 02/02/2012 1752     CBC    Component Value Date/Time   WBC 3.8 (L) 05/14/2022 1301   RBC 4.84 05/14/2022 1301   HGB 15.0 05/14/2022 1301   HCT 43.4 05/14/2022 1301   PLT 239 05/14/2022 1301   MCV 89.7 05/14/2022 1301   MCH 31.0 05/14/2022 1301   MCHC 34.6 05/14/2022 1301   RDW 13.2 05/14/2022 1301   LYMPHSABS 1.7 02/02/2012 1752   MONOABS 0.5 02/02/2012 1752   EOSABS 0.0 02/02/2012 1752   BASOSABS 0.1 02/02/2012 1752     Lab Results  Component Value Date   TSH 3.560 07/14/2022   TSH 79.800 (H) 04/09/2022   TSH 39.300 (H) 03/05/2022   TSH 0.014 (L) 02/06/2022   TSH <0.005 (L) 01/06/2022   TSH 0.008 (L) 10/20/2021   TSH <0.010 (L) 09/24/2021   TSH <0.005 (L) 09/15/2021   TSH <0.005 (L) 08/28/2021   TSH 0.01 (A) 08/15/2021   FREET4 1.49 07/14/2022   FREET4 0.59 (L) 04/09/2022   FREET4 0.27 (L) 03/05/2022   FREET4 1.41 02/06/2022   FREET4 2.64 (H) 01/06/2022   FREET4 1.66 10/20/2021   FREET4 1.17 (H) 09/24/2021   FREET4 1.77 09/15/2021   FREET4 2.23 (H) 08/28/2021     November 06, 2021, thyroid uptake and scan results:  FINDINGS: Homogeneous  tracer distribution in both thyroid lobes.  No focal areas of increased or decreased tracer localization.   4 hour I-123 uptake = 34.1% (normal 5-20%),  24 hour I-123 uptake = 45.7% (normal 10-30%)   IMPRESSION: Normal thyroid scan with elevated 4 hour and 24 hour radio iodine uptakes.  Findings are consistent with Graves disease.  November 12, 2021 RADIOPHARMACEUTICALS:  21.6 mCi I-131 sodium iodide orally  IMPRESSION: Per oral administration of I-131 sodium iodide for the treatment of hyperthyroidism.    Assessment & Plan:   1.  RAI induced hypothyroidism  2.  Graves' disease-resolved 3.  Dyslipidemia He is status post RAI on November 12, 2021.  He did not generate sufficient clinical response from levothyroxine because of which he was switched to Synthroid at 125 mcg p.o. daily before breakfast.  He returns with symptomatic improvement.  He will be continued on the same dose.  His prescription will [facility] in [place].    - We discussed about the correct intake of his thyroid hormone, on empty stomach at fasting, with water, separated by at least 30 minutes from breakfast and other medications,  and separated by more than 4 hours from calcium, iron, multivitamins, acid reflux medications (PPIs). -Patient is made aware of the fact that thyroid hormone replacement is needed for life, dose to be adjusted by periodic monitoring of thyroid function tests.  Regarding his dyslipidemia: New diagnosis.  He will be given choice of lifestyle medicine before considering statins. Whole food plant-based diet was discussed and recommended to him.   -Patient is advised to maintain close follow up with his PCP for primary care needs.   I spent  19  minutes in the care of the patient today including review of labs from Thyroid Function, CMP, and other relevant labs ; imaging/biopsy records (current and previous including abstractions from other facilities); face-to-face time  discussing  his lab results and symptoms, medications doses, his options of short and long term treatment based on the latest standards of care / guidelines;   and documenting the encounter.  Theodosia Blender  participated in the discussions, expressed understanding, and voiced agreement with the above plans.  All questions were answered to his satisfaction. he is encouraged to contact clinic should he have any questions or concerns prior to his return visit.    Follow up plan: No follow-ups on file.   Thank you for involving me in the care of this pleasant patient, and I will continue to update you with his progress.   Glade Lloyd, MD [facility] [address] [place], [place] [address] Phone: [phone] Fax: [phone]  07/20/2022, 4:30 PM

## 2022-10-20 ENCOUNTER — Telehealth: Payer: Self-pay | Admitting: Nurse Practitioner

## 2022-10-20 ENCOUNTER — Encounter: Payer: Self-pay | Admitting: Nurse Practitioner

## 2022-10-20 ENCOUNTER — Ambulatory Visit: Payer: 59 | Attending: Nurse Practitioner | Admitting: Nurse Practitioner

## 2022-10-20 ENCOUNTER — Ambulatory Visit (INDEPENDENT_AMBULATORY_CARE_PROVIDER_SITE_OTHER): Payer: 59

## 2022-10-20 VITALS — BP 124/88 | HR 73 | Ht 64.0 in | Wt 176.0 lb

## 2022-10-20 DIAGNOSIS — R002 Palpitations: Secondary | ICD-10-CM

## 2022-10-20 DIAGNOSIS — E785 Hyperlipidemia, unspecified: Secondary | ICD-10-CM | POA: Diagnosis not present

## 2022-10-20 NOTE — Patient Instructions (Signed)
Medication Instructions:  Your physician recommends that you continue on your current medications as directed. Please refer to the Current Medication list given to you today.  *If you need a refill on your cardiac medications before your next appointment, please call your pharmacy*   Lab Work: None If you have labs (blood work) drawn today and your tests are completely normal, you will receive your results only by: MyChart Message (if you have MyChart) OR A paper copy in the mail If you have any lab test that is abnormal or we need to change your treatment, we will call you to review the results.   Testing/Procedures: None   Follow-Up: At [facility], you and your health needs are our priority.  As part of our continuing mission to provide you with exceptional heart care, we have created designated Provider Care Teams.  These Care Teams include your primary Cardiologist (physician) and Advanced Practice Providers (APPs -  Physician Assistants and Nurse Practitioners) who all work together to provide you with the care you need, when you need it.  We recommend signing up for the patient portal called "MyChart".  Sign up information is provided on this After Visit Summary.  MyChart is used to connect with patients for Virtual Visits (Telemedicine).  Patients are able to view lab/test results, encounter notes, upcoming appointments, etc.  Non-urgent messages can be sent to your provider as well.   To learn more about what you can do with MyChart, go to [email or web address].    Your next appointment:   8 week(s)  Provider:   Sharlene Dory, NP   Other Instructions ZIO XT- Long Term Monitor Instructions   Your physician has requested you wear your ZIO patch monitor___14____days.   This is a single patch monitor.  [employer] supplies one patch monitor per enrollment.  Additional stickers are not available.   Please do not apply patch if you will be having a Nuclear Stress  Test, Echocardiogram, Cardiac CT, MRI, or Chest Xray during the time frame you would be wearing the monitor. The patch cannot be worn during these tests.  You cannot remove and re-apply the ZIO XT patch monitor.   Your ZIO patch monitor will be sent USPS Priority mail from [facility] directly to your home address. The monitor may also be mailed to a PO BOX if home delivery is not available.   It may take 3-5 days to receive your monitor after you have been enrolled.   Once you have received you monitor, please review enclosed instructions.  Your monitor has already been registered assigning a specific monitor serial # to you.   Applying the monitor   Shave hair from upper left chest.   Hold abrader disc by orange tab.  Rub abrader in 40 strokes over left upper chest as indicated in your monitor instructions.   Clean area with 4 enclosed alcohol pads .  Use all pads to assure are is cleaned thoroughly.  Let dry.   Apply patch as indicated in monitor instructions.  Patch will be place under collarbone on left side of chest with arrow pointing upward.   Rub patch adhesive wings for 2 minutes.Remove white label marked "1".  Remove white label marked "2".  Rub patch adhesive wings for 2 additional minutes.   While looking in a mirror, press and release button in center of patch.  A small green light will flash 3-4 times .  This will be your only indicator the monitor has been turned  on.     Do not shower for the first 24 hours.  You may shower after the first 24 hours.   Press button if you feel a symptom. You will hear a small click.  Record Date, Time and Symptom in the Patient Log Book.   When you are ready to remove patch, follow instructions on last 2 pages of Patient Log Book.  Stick patch monitor onto last page of Patient Log Book.   Place Patient Log Book in [place] box.  Use locking tab on box and tape box closed securely.  The [employer] has [employer] on it.   Please place in mailbox as soon as possible.  Your physician should have your test results approximately 7 days after the monitor has been mailed back to [facility]   Call [facility] Customer Care at [phone] if you have questions regarding your ZIO XT patch monitor.  Call them immediately if you see an orange light blinking on your monitor.   If your monitor falls off in less than 4 days contact our Monitor department at [phone].  If your monitor becomes loose or falls off after 4 days call [employer] at [phone] for suggestions on securing your monitor.

## 2022-10-20 NOTE — Progress Notes (Signed)
Office Visit    Patient Name: Brandon Chase Date of Encounter: 10/20/2022  PCP:  Catalina Lunger, DO   [facility]  Cardiologist:  Rollene Rotunda, MD  Advanced Practice Provider:  No care team member to display Electrophysiologist:  None   Chief Complaint    MARTEZE Chase is a 42 y.o. 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    Past Medical History    Past Medical History:  Diagnosis Date   Graves disease    S/P radioactive iodine -> hypothyroidism (on Synthroid)   Hyperlipidemia    Hypothyroidism    Palpitations    Past Surgical History:  Procedure Laterality Date   CYST REMOVAL TRUNK     Also had a cyst removed from right side of cheek   Cyst removed  06/01/2004    Allergies  No Known Allergies  History of Present Illness    Brandon Chase is a 42 y.o. male with a PMH as mentioned above.  Previously seen by Dr. Antoine Poche in January 2014 for palpitations.  Described a sensation of isolated skipped beats, or sensation of pause.  Denied any chest pain.  Description of palpitations did not warrant monitor at the time.  Myoview in April 2023 was arranged and was low risk.  Echocardiogram report noted below.  Visited ED at [facility] for chest tightness in December 2023.  At that time, lab work revealed elevated TSH with normal T3 and T4.  EKG revealed normal sinus rhythm. Troponins were negative.  Given Ativan that improved chest tightness.  Today he presents for evaluation for palpitations.  He tells me he was recently diagnosed with Graves' disease, was treated with Tapazole that did not seem to help, then received radioactive iodine treatment that has now warranted him to receive Synthroid medication.  He is following Dr. Fransico Him with Endocrinology.  Continues to note palpitations, and was advised he follow-up  with cardiology.  He states his palpitations are somewhat better since starting on Synthroid.  Typically occur about once per week, says he was on beta-blockers in the past for this. Says this occurs mostly after meals, difficult to tell how long. Denies any chest pain, shortness of breath, syncope, presyncope, dizziness, orthopnea, PND, swelling or significant weight changes, acute bleeding, or claudication.  Denies any caffeine usage or energy drink consumption.  SH: Works at [employer]  EKGs/Labs/Other Studies Reviewed:   The following studies were reviewed today:   EKG:  EKG is ordered today.  The ekg ordered today demonstrates normal sinus rhythm, 73 bpm, nonspecific IVCD, nonspecific ST/T wave abnormality, no acute ischemic changes.   Myoview 08/2021: IMPRESSION: 1. No reversible ischemia. Small fixed defect is noted involving the apical anterior and apicolateral segments. No corresponding wall motion abnormality. This is favored to represent non uniform soft tissue attenuation artifact.   2. Normal left ventricular wall motion.   3. Left ventricular ejection fraction 64%   4. Non invasive risk stratification*: Low   *2012 Appropriate Use Criteria for Coronary Revascularization Focused Update: J Am Coll Cardiol. 2012;59(9):857-881. http://content.dementiazones.com.aspx?articleid=1201161  Echo 08/2021:  1. Left ventricular ejection fraction, by estimation, is 60%. The left  ventricle has normal function. The left ventricle has no regional wall  motion abnormalities. Left ventricular diastolic parameters were normal.   2. Right ventricular systolic function is normal. The right ventricular  size is mildly enlarged.  Tricuspid regurgitation signal is inadequate for  assessing PA pressure.   3. The mitral valve is normal in structure. Trivial mitral valve  regurgitation. No evidence of mitral stenosis.   4. The aortic valve is tricuspid. Aortic valve regurgitation is  not  visualized. No aortic stenosis is present.   5. The inferior vena cava is normal in size with greater than 50%  respiratory variability, suggesting right atrial pressure of 3 mmHg.  Recent Labs: 05/14/2022: BUN 10; Creatinine, Ser 1.03; Hemoglobin 15.0; Platelets 239; Potassium 4.0; Sodium 138 07/14/2022: TSH 3.560  Recent Lipid Panel    Component Value Date/Time   CHOL 212 (H) 07/14/2022 0804   TRIG 66 07/14/2022 0804   HDL 69 07/14/2022 0804   CHOLHDL 3.1 07/14/2022 0804   LDLCALC 131 (H) 07/14/2022 0804    Risk Assessment/Calculations:   The 10-year ASCVD risk score (Arnett DK, et al., 2019) is: 1.3%   Values used to calculate the score:     Age: 30 years     Sex: Male     Is Non-Hispanic African American: No     Diabetic: No     Tobacco smoker: No     Systolic Blood Pressure: 124 mmHg     Is BP treated: No     HDL Cholesterol: 69 mg/dL     Total Cholesterol: 212 mg/dL   Home Medications   Current Meds  Medication Sig   Cholecalciferol (VITAMIN D3) 50 MCG (2000 UT) TABS Take 1 tablet by mouth daily.   SYNTHROID 125 MCG tablet Take 1 tablet (125 mcg total) by mouth daily before breakfast.     Review of Systems    All other systems reviewed and are otherwise negative except as noted above.  Physical Exam    VS:  BP 124/88   Pulse 73   Ht 5\' 4"  (1.626 m)   Wt 176 lb (79.8 kg)   SpO2 99%   BMI 30.21 kg/m  , BMI Body mass index is 30.21 kg/m.  Wt Readings from Last 3 Encounters:  10/20/22 176 lb (79.8 kg)  07/20/22 179 lb 6.4 oz (81.4 kg)  05/14/22 183 lb 13.8 oz (83.4 kg)     GEN: Well nourished, well developed, in no acute distress. HEENT: normal. Neck: Supple, no JVD, carotid bruits, or masses. Cardiac: S1/S2, RRR, no murmurs, rubs, or gallops. No clubbing, cyanosis, edema.  Radials/PT 2+ and equal bilaterally.  Respiratory:  Respirations regular and unlabored, clear to auscultation bilaterally. MS: No deformity or atrophy. Skin: Warm and dry,  no rash. Neuro:  Strength and sensation are intact. Psych: Normal affect.  Assessment & Plan    Palpitations Recently improved since starting Synthroid, however still continues to note this.  Discussed ZIO monitoring with him, and he is agreeable to proceed.  Will arrange 14-day ZIO XT monitor to evaluate for any arrhythmias.  Just had labs performed, and at next office visit we will consider repeating labs if indicated.  Discussed to limit/avoid caffeine/energy drink consumption.  He verbalized understanding. Heart healthy diet and regular cardiovascular exercise encouraged.    HLD, Elevated LDL LDL level from February 2024 was 131, he says repeat labs revealed LDL at 105.  Discussed/recommended lifestyle modifications including Mediterranean diet. ASCVD risk is low at 1.3%, therefore no indication at this time to start statin. Heart healthy diet and regular cardiovascular exercise encouraged. Continue to follow with PCP.   Disposition: Follow up in 8 week(s) with Rollene Rotunda, MD or APP.  After next  follow-up visit with me, we will get patient to see Dr. Jenene Slicker as he is requesting to be seen here locally in [place] office.  Signed, Sharlene Dory, NP 10/20/2022, 12:46 PM [facility]

## 2022-10-20 NOTE — Telephone Encounter (Signed)
LONG TERM MONITOR [ percert)  

## 2022-10-28 LAB — LIPID PANEL
Chol/HDL Ratio: 2.7 ratio (ref 0.0–5.0)
Cholesterol, Total: 194 mg/dL (ref 100–199)
HDL: 71 mg/dL (ref >39)
LDL Chol Calc (NIH): 112 mg/dL — ABNORMAL HIGH (ref 0–99)
Triglycerides: 59 mg/dL (ref 0–149)
VLDL Cholesterol Cal: 11 mg/dL (ref 5–40)

## 2022-10-28 LAB — TSH: TSH: 2.51 u[IU]/mL (ref 0.450–4.500)

## 2022-10-28 LAB — T4, FREE: Free T4: 1.62 ng/dL (ref 0.82–1.77)

## 2022-11-02 ENCOUNTER — Ambulatory Visit: Payer: 59 | Admitting: "Endocrinology

## 2022-11-02 ENCOUNTER — Encounter: Payer: Self-pay | Admitting: "Endocrinology

## 2022-11-02 VITALS — BP 98/70 | HR 68 | Ht 64.0 in | Wt 176.8 lb

## 2022-11-02 DIAGNOSIS — E782 Mixed hyperlipidemia: Secondary | ICD-10-CM | POA: Diagnosis not present

## 2022-11-02 DIAGNOSIS — E890 Postprocedural hypothyroidism: Secondary | ICD-10-CM

## 2022-11-02 MED ORDER — SYNTHROID 125 MCG PO TABS: 125.0000 ug | ORAL_TABLET | Freq: Every day | ORAL | 1 refills | Status: DC

## 2022-11-02 NOTE — Progress Notes (Signed)
11/02/2022    Endocrinology follow-up note   Subjective:    Patient ID: Brandon Chase, male    DOB: [birth year], PCP Catalina Lunger, DO.   Past Medical History:  Diagnosis Date   Graves disease    S/P radioactive iodine -> hypothyroidism (on Synthroid)   Hyperlipidemia    Hypothyroidism    Palpitations     Past Surgical History:  Procedure Laterality Date   CYST REMOVAL TRUNK     Also had a cyst removed from right side of cheek   Cyst removed  06/01/2004    Social History   Socioeconomic History   Marital status: Married    Spouse name: Not on file   Number of children: 2   Years of education: Not on file   Highest education level: Not on file  Occupational History   Occupation: Personnel officer    Comment: Company secretary  Tobacco Use   Smoking status: Never   Smokeless tobacco: Never  Vaping Use   Vaping Use: Never used  Substance and Sexual Activity   Alcohol use: No   Drug use: No   Sexual activity: Not on file  Other Topics Concern   Not on file  Social History Narrative   Not on file   Social Determinants of Health   Financial Resource Strain: Not on file  Food Insecurity: Not on file  Transportation Needs: Not on file  Physical Activity: Not on file  Stress: Not on file  Social Connections: Not on file    Family History  Problem Relation Age of Onset   Thyroid disease Mother     Outpatient Encounter Medications as of 11/02/2022  Medication Sig   Cholecalciferol (VITAMIN D3) 50 MCG (2000 UT) TABS Take 1 tablet by mouth daily.   SYNTHROID 125 MCG tablet Take 1 tablet (125 mcg total) by mouth daily before breakfast.   [DISCONTINUED] SYNTHROID 125 MCG tablet Take 1 tablet (125 mcg total) by mouth daily before breakfast.   No facility-administered encounter medications on file as of 11/02/2022.    ALLERGIES: No Known Allergies  VACCINATION STATUS:  There is no immunization history on file for this patient.   HPI  Brandon Chase  is 42 y.o. male who presents today, accompanied by his wife, with a medical history as above. he was was given radioactive iodine treatment for hyperthyroidism from Graves' disease.  He received this intervention on November 12, 2021.  He was subsequently initiated on thyroid hormone replacement in the form of Synthroid.  He is currently on Synthroid 125 mcg p.o. daily before breakfast.  He continues to tolerate this medication, previsit thyroid function tests are consistent with appropriate replacement.   he denies dysphagia, choking, shortness of breath, no recent voice change.    he does have family history of thyroid dysfunction in his mother and grandparents (thinks hypothyroidism), but denies family hx of thyroid cancer. he denies personal history of goiter.  Denies use of Biotin containing supplements.    His previsit labs show significant dyslipidemia.  Review of systems Steady weight since last visit. Constitutional:   Body mass index is 30.35 kg/m., no fatigue, no subjective hyperthermia, no subjective hypothermia.  Eyes: no blurry vision, no xerophthalmia ENT: no sore throat, no nodules palpated in throat, no dysphagia/odynophagia, no hoarseness Cardiovascular: no chest pain, no shortness of breath, + intermittent palpitations, wore Holter monitor and submitted today waiting for analysis, no leg swelling   Objective:    BP 98/70  Pulse 68   Ht 5\' 4"  (1.626 m)   Wt 176 lb 12.8 oz (80.2 kg)   BMI 30.35 kg/m   Wt Readings from Last 3 Encounters:  11/02/22 176 lb 12.8 oz (80.2 kg)  10/20/22 176 lb (79.8 kg)  07/20/22 179 lb 6.4 oz (81.4 kg)     BP Readings from Last 3 Encounters:  11/02/22 98/70  10/20/22 124/88  07/20/22 102/68            Physical Exam- Limited  Constitutional:  Body mass index is 30.35 kg/m. , not in acute distress, normal state of mind Eyes:  EOMI, no exophthalmos Neck: Supple Thyroid: No gross goiter, ? mildly enlarged, mildly tender right lobe   thyroid.      CMP     Component Value Date/Time   NA 138 05/14/2022 1301   K 4.0 05/14/2022 1301   CL 99 05/14/2022 1301   CO2 27 05/14/2022 1301   GLUCOSE 99 05/14/2022 1301   BUN 10 05/14/2022 1301   CREATININE 1.03 05/14/2022 1301   CALCIUM 10.5 (H) 05/14/2022 1301   PROT 5.4 (L) 09/25/2021 0332   ALBUMIN 3.1 (L) 09/25/2021 0332   AST 20 09/25/2021 0332   ALT 31 09/25/2021 0332   ALKPHOS 41 09/25/2021 0332   BILITOT 0.4 09/25/2021 0332   GFRNONAA >60 05/14/2022 1301   GFRAA >90 02/02/2012 1752     CBC    Component Value Date/Time   WBC 3.8 (L) 05/14/2022 1301   RBC 4.84 05/14/2022 1301   HGB 15.0 05/14/2022 1301   HCT 43.4 05/14/2022 1301   PLT 239 05/14/2022 1301   MCV 89.7 05/14/2022 1301   MCH 31.0 05/14/2022 1301   MCHC 34.6 05/14/2022 1301   RDW 13.2 05/14/2022 1301   LYMPHSABS 1.7 02/02/2012 1752   MONOABS 0.5 02/02/2012 1752   EOSABS 0.0 02/02/2012 1752   BASOSABS 0.1 02/02/2012 1752    Latest Reference Range & Units 07/14/22 08:04 10/27/22 08:40  TSH 0.450 - 4.500 uIU/mL 3.560 2.510  T4,Free(Direct) 0.82 - 1.77 ng/dL 1.61 0.96      November 06, 2021, thyroid uptake and scan results:  FINDINGS: Homogeneous tracer distribution in both thyroid lobes.  No focal areas of increased or decreased tracer localization.   4 hour I-123 uptake = 34.1% (normal 5-20%),  24 hour I-123 uptake = 45.7% (normal 10-30%)   IMPRESSION: Normal thyroid scan with elevated 4 hour and 24 hour radio iodine uptakes.  Findings are consistent with Graves disease.  November 12, 2021 RADIOPHARMACEUTICALS:  21.6 mCi I-131 sodium iodide orally   IMPRESSION: Per oral administration of I-131 sodium iodide for the treatment of hyperthyroidism.    Assessment & Plan:   1.  RAI induced hypothyroidism  2.  Graves' disease-resolved 3.  Dyslipidemia He is status post RAI on November 12, 2021.   He is on Synthroid for RAI induced hypothyroidism.  His previsit labs are consistent with  appropriate replacement.    - We discussed about the correct intake of his thyroid hormone, on empty stomach at fasting, with water, separated by at least 30 minutes from breakfast and other medications,  and separated by more than 4 hours from calcium, iron, multivitamins, acid reflux medications (PPIs). -Patient is made aware of the fact that thyroid hormone replacement is needed for life, dose to be adjusted by periodic monitoring of thyroid function tests. Regarding his dyslipidemia: He was given lifestyle medicine nutrition advice with whole food plant-based diet.  He shows significant improvement,  no need for statin treatment at this time.  He will have repeat fasting lipid panel along with his next thyroid function tests, a.m. cortisol before his next visit.     -Patient is advised to maintain close follow up with his PCP for primary care needs.   I spent  22  minutes in the care of the patient today including review of labs from Thyroid Function, CMP, and other relevant labs ; imaging/biopsy records (current and previous including abstractions from other facilities); face-to-face time discussing  his lab results and symptoms, medications doses, his options of short and long term treatment based on the latest standards of care / guidelines;   and documenting the encounter.  Maxwell Marion  participated in the discussions, expressed understanding, and voiced agreement with the above plans.  All questions were answered to his satisfaction. he is encouraged to contact clinic should he have any questions or concerns prior to his return visit.    Follow up plan: Return in about 6 months (around 05/04/2023) for Fasting Labs  in AM B4 8.   Thank you for involving me in the care of this pleasant patient, and I will continue to update you with his progress.   Marquis Lunch, MD [facility] [address] [place], [place] [address] Phone: [phone] Fax:  [phone]  11/02/2022, 8:35 PM

## 2022-12-22 ENCOUNTER — Encounter: Payer: Self-pay | Admitting: Nurse Practitioner

## 2022-12-22 ENCOUNTER — Ambulatory Visit: Payer: 59 | Attending: Nurse Practitioner | Admitting: Nurse Practitioner

## 2022-12-22 VITALS — BP 116/76 | HR 95 | Ht 64.0 in | Wt 181.8 lb

## 2022-12-22 DIAGNOSIS — E785 Hyperlipidemia, unspecified: Secondary | ICD-10-CM | POA: Diagnosis not present

## 2022-12-22 DIAGNOSIS — R002 Palpitations: Secondary | ICD-10-CM

## 2022-12-22 DIAGNOSIS — R42 Dizziness and giddiness: Secondary | ICD-10-CM | POA: Diagnosis not present

## 2022-12-22 NOTE — Patient Instructions (Addendum)
Medication Instructions:  Your physician recommends that you continue on your current medications as directed. Please refer to the Current Medication list given to you today.  Labwork: none  Testing/Procedures: none  Follow-Up: Your physician recommends that you schedule a follow-up appointment in: 6 Months with Dr.Mallipeddi  Any Other Special Instructions Will Be Listed Below (If Applicable).  If you need a refill on your cardiac medications before your next appointment, please call your pharmacy.

## 2022-12-22 NOTE — Progress Notes (Unsigned)
Office Visit    Patient Name: Brandon Chase Date of Encounter: 12/22/2022 PCP:  Catalina Lunger, DO [facility]  Cardiologist:  Marjo Bicker, MD  Advanced Practice Provider:  No care team member to display Electrophysiologist:  None   Chief Complaint and HPI    Brandon Chase is a 42 y.o. male with a hx of palpitations, history of Graves' disease, s/p radioactive iodine -> hypothyroidism, and hyperlipidemia, and vertigo who presents today for palpitations follow-up.    Previously seen by Dr. Antoine Poche in January 2014 for palpitations.  Described a sensation of isolated skipped beats, or sensation of pause.  Denied any chest pain.  Description of palpitations did not warrant monitor at the time.  Myoview in April 2023 was arranged and was low risk.  Echocardiogram report noted below.  Visited ED at [facility] for chest tightness 05/2022.  At that time, lab work revealed elevated TSH with normal T3 and T4.  EKG revealed normal sinus rhythm. Troponins negative.  Given Ativan that improved chest tightness.  Last saw patient for evaluation for palpitations on Oct 20, 2022.  He had been recently diagnosed with Graves' disease, was treated with Tapazole that did not seem to help, then received radioactive iodine treatment that warranted him to receive Synthroid medication. Following Dr. Fransico Him with Endocrinology.  Continued to note palpitations, and was advised to follow-up with cardiology. Stated palpitations were somewhat better since starting on Synthroid.  Typically occur about once per week, said he was on beta-blockers in the past for this, typically occurred mostly after meals, difficult to tell how long. Denied any chest pain, shortness of breath, syncope, presyncope, dizziness, orthopnea, PND, swelling or significant weight changes, acute bleeding, or claudication. Monitor was unremarkable.  Today he presents for follow-up. Recently has had episodes of  vertigo. Says tried taking 2 tablets of Meclizine at night, but made him too drowsy. Palpitations are rare and brief, tells me his thyroid levels have improved since being on thyroid medication. Denies any chest pain, shortness of breath, syncope, presyncope, dizziness, orthopnea, PND, swelling or significant weight changes, acute bleeding, or claudication.  SH: Works at [employer]  EKGs/Labs/Other Studies Reviewed:   The following studies were reviewed today:   EKG:  EKG is not ordered today.   Monitor 11/10/2022: Patient had a min HR of 45 bpm, max HR of 149 bpm, and avg HR of 83 bpm. Predominant underlying rhythm was Sinus Rhythm. Isolated SVEs were rare (<1.0%), SVE Couplets were rare (<1.0%), and no SVE Triplets were present. Isolated VEs were rare (<1.0%), VE Couplets were rare (<1.0%), and no VE Triplets were present.   Myoview 08/2021: IMPRESSION: 1. No reversible ischemia. Small fixed defect is noted involving the apical anterior and apicolateral segments. No corresponding wall motion abnormality. This is favored to represent non uniform soft tissue attenuation artifact.   2. Normal left ventricular wall motion.   3. Left ventricular ejection fraction 64%   4. Non invasive risk stratification*: Low   *2012 Appropriate Use Criteria for Coronary Revascularization Focused Update: J Am Coll Cardiol. 2012;59(9):857-881. http://content.dementiazones.com.aspx?articleid=1201161  Echo 08/2021:  1. Left ventricular ejection fraction, by estimation, is 60%. The left  ventricle has normal function. The left ventricle has no regional wall  motion abnormalities. Left ventricular diastolic parameters were normal.   2. Right ventricular systolic function is normal. The right ventricular  size is mildly enlarged. Tricuspid regurgitation signal is inadequate for  assessing PA pressure.   3.  The mitral valve is normal in structure. Trivial mitral valve  regurgitation. No evidence  of mitral stenosis.   4. The aortic valve is tricuspid. Aortic valve regurgitation is not  visualized. No aortic stenosis is present.   5. The inferior vena cava is normal in size with greater than 50%  respiratory variability, suggesting right atrial pressure of 3 mmHg.  Risk Assessment/Calculations:   The 10-year ASCVD risk score (Arnett DK, et al., 2019) is: 0.9%   Values used to calculate the score:     Age: 14 years     Sex: Male     Is Non-Hispanic African American: No     Diabetic: No     Tobacco smoker: No     Systolic Blood Pressure: 116 mmHg     Is BP treated: No     HDL Cholesterol: 71 mg/dL     Total Cholesterol: 194 mg/dL   Home Medications   Current Meds  Medication Sig   Cholecalciferol (VITAMIN D3) 50 MCG (2000 UT) TABS Take 1 tablet by mouth daily.   SYNTHROID 125 MCG tablet Take 1 tablet (125 mcg total) by mouth daily before breakfast.     Review of Systems    All other systems reviewed and are otherwise negative except as noted above.  Physical Exam    VS:  BP 116/76   Pulse 95   Ht 5\' 4"  (1.626 m)   Wt 181 lb 12.8 oz (82.5 kg)   SpO2 96%   BMI 31.21 kg/m  , BMI Body mass index is 31.21 kg/m.  Wt Readings from Last 3 Encounters:  12/22/22 181 lb 12.8 oz (82.5 kg)  11/02/22 176 lb 12.8 oz (80.2 kg)  10/20/22 176 lb (79.8 kg)     GEN: Well nourished, well developed, in no acute distress. HEENT: normal. Neck: Supple, no JVD, carotid bruits, or masses. Cardiac: S1/S2, RRR, no murmurs, rubs, or gallops. No clubbing, cyanosis, edema.  Radials/PT 2+ and equal bilaterally.  Respiratory:  Respirations regular and unlabored, clear to auscultation bilaterally. MS: No deformity or atrophy. Skin: Warm and dry, no rash. Neuro:  Strength and sensation are intact. Psych: Normal affect.  Assessment & Plan    Palpitations Rare, brief in duration, and stable per his report. Recent monitor was unremarkable. Discussed to limit/avoid caffeine/energy drink  consumption as well as soda consumption.  He verbalized understanding. Heart healthy diet and regular cardiovascular exercise encouraged.   HLD, Elevated LDL LDL level from February 2024 was 131, repeat labs revealed LDL at 112, has upcoming lipid panel pending that is being followed by Endocrinology.  Discussed/recommended lifestyle modifications including Mediterranean diet. ASCVD risk is low at 1.3%, therefore no indication at this time to start statin. Heart healthy diet and regular cardiovascular exercise encouraged. Continue to follow with PCP.  3. Vertigo Discussed to try taking 1 tablet of Meclizine instead of two tablets at night on weekend night to see if this helps, since 2 tablets made him drowsy. Discussed/recommended Epley maneuvers and have printed off these instructions for him to perform these maneuvers at home. Continue to follow with PCP.  Disposition: Requesting cardiac care closer to home. Follow up in 6 months with Dr. Jenene Slicker or APP.    Signed, Sharlene Dory, NP 12/24/2022, 5:42 AM [facility]

## 2023-02-26 ENCOUNTER — Telehealth: Payer: Self-pay

## 2023-02-26 NOTE — Telephone Encounter (Signed)
Spoke with pt's wife advising that pt's thyroid lab numbers are where Dr.Nida would like them to be and he doesn't feel that pt's symptoms are due to thyroid per Dr.Nida. Pt's wife voiced understanding.

## 2023-02-26 NOTE — Telephone Encounter (Signed)
Pt's wife called stating was seen by his PCP this week. States he's starting to experience anxiety, palpitations, nervous and fatigue. PCP had thyroid labs drawn and suggested he contact our office for adjustments to his Synthroid. Pt is taking brand name Synthroid daily. Placed a copy of thyroid labs on Dr.Nida's desk.

## 2023-04-12 ENCOUNTER — Encounter: Payer: Self-pay | Admitting: Nurse Practitioner

## 2023-04-12 ENCOUNTER — Ambulatory Visit: Payer: 59 | Attending: Nurse Practitioner | Admitting: Nurse Practitioner

## 2023-04-12 VITALS — BP 112/72 | HR 78 | Ht 64.0 in | Wt 177.6 lb

## 2023-04-12 DIAGNOSIS — R079 Chest pain, unspecified: Secondary | ICD-10-CM | POA: Diagnosis not present

## 2023-04-12 DIAGNOSIS — R5383 Other fatigue: Secondary | ICD-10-CM

## 2023-04-12 DIAGNOSIS — E890 Postprocedural hypothyroidism: Secondary | ICD-10-CM

## 2023-04-12 DIAGNOSIS — R002 Palpitations: Secondary | ICD-10-CM

## 2023-04-12 DIAGNOSIS — E785 Hyperlipidemia, unspecified: Secondary | ICD-10-CM

## 2023-04-12 NOTE — Patient Instructions (Addendum)
Medication Instructions:  Your physician recommends that you continue on your current medications as directed. Please refer to the Current Medication list given to you today.  Labwork: In 1 week   Testing/Procedures: None   Follow-Up: Your physician recommends that you schedule a follow-up appointment in: 6 weeks   Any Other Special Instructions Will Be Listed Below (If Applicable).  If you need a refill on your cardiac medications before your next appointment, please call your pharmacy.

## 2023-04-12 NOTE — Progress Notes (Unsigned)
Office Visit    Patient Name: Brandon Chase Date of Encounter: 12/22/2022 PCP:  Catalina Lunger, DO [facility]  Cardiologist:  Marjo Bicker, MD  Advanced Practice Provider:  No care team member to display Electrophysiologist:  None   Chief Complaint and HPI    Brandon Chase is a 42 y.o. male with a hx of palpitations, history of Graves' disease, s/p radioactive iodine -> hypothyroidism, and hyperlipidemia, and vertigo who presents today for palpitations follow-up.    Previously seen by Dr. Antoine Poche in January 2014 for palpitations.  Described a sensation of isolated skipped beats, or sensation of pause.  Denied any chest pain.  Description of palpitations did not warrant monitor at the time.  Myoview in April 2023 was arranged and was low risk.  Echocardiogram report noted below.  Visited ED at [facility] for chest tightness 05/2022.  At that time, lab work revealed elevated TSH with normal T3 and T4.  EKG revealed normal sinus rhythm. Troponins negative.  Given Ativan that improved chest tightness.  Last saw patient for evaluation for palpitations on Oct 20, 2022.  He had been recently diagnosed with Graves' disease, was treated with Tapazole that did not seem to help, then received radioactive iodine treatment that warranted him to receive Synthroid medication. Following Dr. Fransico Him with Endocrinology.  Continued to note palpitations, and was advised to follow-up with cardiology. Stated palpitations were somewhat better since starting on Synthroid.  Typically occur about once per week, said he was on beta-blockers in the past for this, typically occurred mostly after meals, difficult to tell how long. Denied any chest pain, shortness of breath, syncope, presyncope, dizziness, orthopnea, PND, swelling or significant weight changes, acute bleeding, or claudication. Monitor was unremarkable.  Today he presents for follow-up. Recently has had episodes of  vertigo. Says tried taking 2 tablets of Meclizine at night, but made him too drowsy. Palpitations are rare and brief, tells me his thyroid levels have improved since being on thyroid medication. Denies any chest pain, shortness of breath, syncope, presyncope, dizziness, orthopnea, PND, swelling or significant weight changes, acute bleeding, or claudication.  SH: Works at [employer]  EKGs/Labs/Other Studies Reviewed:   The following studies were reviewed today:   EKG:  EKG is not ordered today.   Monitor 11/10/2022: Patient had a min HR of 45 bpm, max HR of 149 bpm, and avg HR of 83 bpm. Predominant underlying rhythm was Sinus Rhythm. Isolated SVEs were rare (<1.0%), SVE Couplets were rare (<1.0%), and no SVE Triplets were present. Isolated VEs were rare (<1.0%), VE Couplets were rare (<1.0%), and no VE Triplets were present.   Myoview 08/2021: IMPRESSION: 1. No reversible ischemia. Small fixed defect is noted involving the apical anterior and apicolateral segments. No corresponding wall motion abnormality. This is favored to represent non uniform soft tissue attenuation artifact.   2. Normal left ventricular wall motion.   3. Left ventricular ejection fraction 64%   4. Non invasive risk stratification*: Low   *2012 Appropriate Use Criteria for Coronary Revascularization Focused Update: J Am Coll Cardiol. 2012;59(9):857-881. http://content.dementiazones.com.aspx?articleid=1201161  Echo 08/2021:  1. Left ventricular ejection fraction, by estimation, is 60%. The left  ventricle has normal function. The left ventricle has no regional wall  motion abnormalities. Left ventricular diastolic parameters were normal.   2. Right ventricular systolic function is normal. The right ventricular  size is mildly enlarged. Tricuspid regurgitation signal is inadequate for  assessing PA pressure.   3.  The mitral valve is normal in structure. Trivial mitral valve  regurgitation. No evidence  of mitral stenosis.   4. The aortic valve is tricuspid. Aortic valve regurgitation is not  visualized. No aortic stenosis is present.   5. The inferior vena cava is normal in size with greater than 50%  respiratory variability, suggesting right atrial pressure of 3 mmHg.  Risk Assessment/Calculations:   The 10-year ASCVD risk score (Arnett DK, et al., 2019) is: 1%   Values used to calculate the score:     Age: 6 years     Sex: Male     Is Non-Hispanic African American: No     Diabetic: No     Tobacco smoker: No     Systolic Blood Pressure: 122 mmHg     Is BP treated: No     HDL Cholesterol: 71 mg/dL     Total Cholesterol: 194 mg/dL   Home Medications   No outpatient medications have been marked as taking for the 04/12/23 encounter (Appointment) with Sharlene Dory, NP.     Review of Systems    All other systems reviewed and are otherwise negative except as noted above.  Physical Exam    VS:  There were no vitals taken for this visit. , BMI There is no height or weight on file to calculate BMI.  Wt Readings from Last 3 Encounters:  12/22/22 181 lb 12.8 oz (82.5 kg)  11/02/22 176 lb 12.8 oz (80.2 kg)  10/20/22 176 lb (79.8 kg)     GEN: Well nourished, well developed, in no acute distress. HEENT: normal. Neck: Supple, no JVD, carotid bruits, or masses. Cardiac: S1/S2, RRR, no murmurs, rubs, or gallops. No clubbing, cyanosis, edema.  Radials/PT 2+ and equal bilaterally.  Respiratory:  Respirations regular and unlabored, clear to auscultation bilaterally. MS: No deformity or atrophy. Skin: Warm and dry, no rash. Neuro:  Strength and sensation are intact. Psych: Normal affect.  Assessment & Plan    Palpitations Rare, brief in duration, and stable per his report. Recent monitor was unremarkable. Discussed to limit/avoid caffeine/energy drink consumption as well as soda consumption.  He verbalized understanding. Heart healthy diet and regular cardiovascular exercise  encouraged.   HLD, Elevated LDL LDL level from February 2024 was 131, repeat labs revealed LDL at 112, has upcoming lipid panel pending that is being followed by Endocrinology.  Discussed/recommended lifestyle modifications including Mediterranean diet. ASCVD risk is low at 1.3%, therefore no indication at this time to start statin. Heart healthy diet and regular cardiovascular exercise encouraged. Continue to follow with PCP.  3. Vertigo Discussed to try taking 1 tablet of Meclizine instead of two tablets at night on weekend night to see if this helps, since 2 tablets made him drowsy. Discussed/recommended Epley maneuvers and have printed off these instructions for him to perform these maneuvers at home. Continue to follow with PCP.  Disposition: Requesting cardiac care closer to home. Follow up in 6 months with Dr. Jenene Slicker or APP.    Signed, Sharlene Dory, NP 04/12/2023, 12:23 PM [facility]

## 2023-04-14 LAB — LIPID PANEL
Chol/HDL Ratio: 3.1 ratio (ref 0.0–5.0)
Cholesterol, Total: 214 mg/dL — ABNORMAL HIGH (ref 100–199)
HDL: 68 mg/dL (ref >39)
LDL Chol Calc (NIH): 135 mg/dL — ABNORMAL HIGH (ref 0–99)
Triglycerides: 61 mg/dL (ref 0–149)
VLDL Cholesterol Cal: 11 mg/dL (ref 5–40)

## 2023-04-14 LAB — THYROID PANEL WITH TSH
Free Thyroxine Index: 2.6 (ref 1.2–4.9)
T3 Uptake Ratio: 28 % (ref 24–39)
T4, Total: 9.3 ug/dL (ref 4.5–12.0)
TSH: 4.31 u[IU]/mL (ref 0.450–4.500)

## 2023-05-04 ENCOUNTER — Ambulatory Visit: Payer: 59 | Admitting: "Endocrinology

## 2023-05-24 ENCOUNTER — Ambulatory Visit: Payer: 59 | Admitting: Nurse Practitioner

## 2023-05-24 ENCOUNTER — Encounter: Payer: Self-pay | Admitting: Nurse Practitioner

## 2023-07-05 ENCOUNTER — Telehealth: Payer: Self-pay | Admitting: "Endocrinology

## 2023-07-05 NOTE — Telephone Encounter (Signed)
Received medical records request from [place] - will send to Medical Records as soon as our fax machine is up and going

## 2023-11-08 ENCOUNTER — Ambulatory Visit: Attending: Internal Medicine | Admitting: Internal Medicine

## 2023-11-08 ENCOUNTER — Encounter: Payer: Self-pay | Admitting: Internal Medicine

## 2023-11-08 VITALS — BP 104/72 | HR 95 | Ht 64.0 in | Wt 183.6 lb

## 2023-11-08 DIAGNOSIS — R002 Palpitations: Secondary | ICD-10-CM

## 2023-11-08 NOTE — Patient Instructions (Addendum)
 Medication Instructions:  Your physician recommends that you continue on your current medications as directed. Please refer to the Current Medication list given to you today.   Labwork: None  Testing/Procedures: None  Follow-Up: Your physician recommends that you schedule a follow-up appointment in: Follow up as needed  Any Other Special Instructions Will Be Listed Below (If Applicable).  Magnesium Tartrate can be ordered on [employer]   Thank you for choosing [facility]!     If you need a refill on your cardiac medications before your next appointment, please call your pharmacy.

## 2023-11-08 NOTE — Progress Notes (Signed)
 Cardiology Office Note  Date: 11/08/2023   ID: SANTOS SOLLENBERGER, DOB 1980/07/07, MRN [id number]  PCP:  Omie Bickers, MD  Cardiologist:  Lasalle Pointer, MD Electrophysiologist:  None   History of Present Illness: Brandon Chase is a 43 y.o. male  No interval chest pain.  He describes palpitations as pounding sensation of his heart and not heart racing.  No other symptoms of dizziness, syncope, leg swelling.  No DOE.  Echo and stress test were unremarkable in 2023.  Event monitor results were unremarkable.  I reviewed his EKG strips on his phone that showed PAC and PVC with NSR.  He had a history of Graves' disease, underwent ablation and currently on levothyroxine .  He noticed that he gets his palpitations mainly when his thyroid  levels are fluctuating.  Quit drinking caffeine products around few months ago but he did not notice any improvement in his palpitations.  Past Medical History:  Diagnosis Date   Graves disease    S/P radioactive iodine -> hypothyroidism (on Synthroid )   Hyperlipidemia    Hypothyroidism    Palpitations     Past Surgical History:  Procedure Laterality Date   CYST REMOVAL TRUNK     Also had a cyst removed from right side of cheek   Cyst removed  06/01/2004    Current Outpatient Medications  Medication Sig Dispense Refill   Cholecalciferol (VITAMIN D3) 50 MCG (2000 UT) TABS Take 1 tablet by mouth daily.     levothyroxine  (SYNTHROID ) 125 MCG tablet Take 125 mcg by mouth as directed. Alternates with the 112mcg     propranolol  (INDERAL ) 10 MG tablet Take 5 mg by mouth as needed. Typically takes a 1/4 tab when needed     SYNTHROID  112 MCG tablet Take 1 tablet by mouth as directed. Alternates with the 125mcg     No current facility-administered medications for this visit.   Allergies:  Pseudoephedrine   Social History: The patient  reports that he has never smoked. He has never used smokeless tobacco. He reports that he does not drink alcohol and  does not use drugs.   Family History: The patient's family history includes Thyroid  disease in his mother.   ROS:  Please see the history of present illness. Otherwise, complete review of systems is positive for none  All other systems are reviewed and negative.   Physical Exam: VS:  BP 104/72   Pulse 95   Ht 5\' 4"  (1.626 m)   Wt 183 lb 9.6 oz (83.3 kg)   SpO2 96%   BMI 31.51 kg/m , BMI Body mass index is 31.51 kg/m.  Wt Readings from Last 3 Encounters:  11/08/23 183 lb 9.6 oz (83.3 kg)  04/12/23 177 lb 9.6 oz (80.6 kg)  12/22/22 181 lb 12.8 oz (82.5 kg)    General: Patient appears comfortable at rest. HEENT: Conjunctiva and lids normal, oropharynx clear with moist mucosa. Neck: Supple, no elevated JVP or carotid bruits, no thyromegaly. Lungs: Clear to auscultation, nonlabored breathing at rest. Cardiac: Regular rate and rhythm, no S3 or significant systolic murmur, no pericardial rub. Abdomen: Soft, nontender, no hepatomegaly, bowel sounds present, no guarding or rebound. Extremities: No pitting edema, distal pulses 2+. Skin: Warm and dry. Musculoskeletal: No kyphosis. Neuropsychiatric: Alert and oriented x3, affect grossly appropriate.  Recent Labwork: 04/13/2023: TSH 4.310     Component Value Date/Time   CHOL 214 (H) 04/13/2023 0808   TRIG 61 04/13/2023 0808   HDL 68 04/13/2023 6962  CHOLHDL 3.1 04/13/2023 0808   LDLCALC 135 (H) 04/13/2023 4098    Other Studies Reviewed Today:   Assessment and Plan:  Palpitations: Improved with propranolol .  Event monitor unremarkable.  He does not report heart racing but pounding sensation of his heart.  Can purchase magnesium taurate from [employer] to help with these palpitations.  Chest pain, resolved: NM stress test and echocardiogram unremarkable in 2023.       Medication Adjustments/Labs and Tests Ordered: Current medicines are reviewed at length with the patient today.  Concerns regarding medicines are outlined above.     Disposition:  Follow up prn  Signed Merica Prell Beauford Bounds, MD, 11/08/2023 3:13 PM    [facility] at [facility] [address] [place], [place], [place] [address]

## 2023-11-28 LAB — COLOGUARD: COLOGUARD: NEGATIVE

## 2023-12-31 ENCOUNTER — Telehealth: Payer: Self-pay | Admitting: Internal Medicine

## 2023-12-31 NOTE — Telephone Encounter (Signed)
 Sure, but his last work up for appropriate based on chart review and was recommended to be seen on prn basis.   Hafiz Irion [place], DO, FACC

## 2023-12-31 NOTE — Telephone Encounter (Signed)
 Pt requesting provider switch to Dr. Michele. Please advise.

## 2024-02-09 ENCOUNTER — Ambulatory Visit: Attending: Cardiology | Admitting: Cardiology

## 2024-02-09 ENCOUNTER — Encounter: Payer: Self-pay | Admitting: Cardiology

## 2024-02-09 VITALS — BP 107/74 | HR 77 | Resp 16 | Ht 64.0 in | Wt 182.8 lb

## 2024-02-09 DIAGNOSIS — E890 Postprocedural hypothyroidism: Secondary | ICD-10-CM

## 2024-02-09 DIAGNOSIS — R002 Palpitations: Secondary | ICD-10-CM

## 2024-02-09 DIAGNOSIS — E782 Mixed hyperlipidemia: Secondary | ICD-10-CM

## 2024-02-09 NOTE — Progress Notes (Signed)
 Cardiology Office Note:  .   Date:  02/09/2024  ID:  Brandon Chase, DOB May 14, 1981, MRN [id number] PCP:  Shona Norleen PEDLAR, MD  Former Cardiology Providers:  Diannah SHAUNNA Maywood, MD [facility] Providers Cardiologist:  Madonna Large, DO , [facility] (established care 02/09/24) Electrophysiologist:  None  Click to update primary MD,subspecialty MD or APP then REFRESH:1}    Chief Complaint  Patient presents with   Palpitations   Follow-up    History of Present Illness: .   Brandon Chase is a 43 y.o. Caucasian male whose past medical history and cardiovascular risk factors includes: History of Graves' disease status post ablation currently on levothyroxine  for hypothyroidism, hyperlipidemia,  Formally under the care of  Vishnu P Mallipeddi, MD who last saw Brandon Chase back in 11/08/2023. I am seeing him for the first time to re-establishing care.   Review of electronic medical records illustrates that he had an echo as well as a stress test in 2023 which were unremarkable.  He has also undergone a cardiac monitor for symptoms of palpitation.  He has hypothyroidism following radioactive iodine ablation for Graves' disease and is on Synthroid , alternating between 125 mcg and 112 mcg doses. Heart palpitations have improved over the last month and a half. He denies syncope, chest pain, shortness of breath, lightheadedness, or dizziness. He monitors his heart rate with a watch and notes extra beats. He uses propranolol  as needed for palpitations, taking a quarter of a 10 mg pill.   No structured exercise program or daily routine.  No first degree relatives with premature coronary disease or sudden cardiac death.  Review of Systems: .   Review of Systems  Constitutional: Positive for malaise/fatigue.  Cardiovascular:  Negative for chest pain, claudication, irregular heartbeat, leg swelling, near-syncope, orthopnea, palpitations, paroxysmal nocturnal dyspnea and syncope.  Respiratory:   Negative for shortness of breath.   Hematologic/Lymphatic: Negative for bleeding problem.    Studies Reviewed:   EKG: EKG Interpretation Date/Time:  Wednesday February 09 2024 16:02:14 EDT Ventricular Rate:  76 PR Interval:  166 QRS Duration:  80 QT Interval:  348 QTC Calculation: 391 R Axis:   6  Text Interpretation: Normal sinus rhythm Normal ECG When compared with ECG of 08-Nov-2023 09:35, No significant change was found Confirmed by Large Madonna [phone]) on 02/09/2024 4:26:26 PM  Echocardiogram: 08/2021  1. Left ventricular ejection fraction, by estimation, is 60%. The left  ventricle has normal function. The left ventricle has no regional wall  motion abnormalities. Left ventricular diastolic parameters were normal.   2. Right ventricular systolic function is normal. The right ventricular  size is mildly enlarged. Tricuspid regurgitation signal is inadequate for  assessing PA pressure.   3. The mitral valve is normal in structure. Trivial mitral valve  regurgitation. No evidence of mitral stenosis.   4. The aortic valve is tricuspid. Aortic valve regurgitation is not  visualized. No aortic stenosis is present.   5. The inferior vena cava is normal in size with greater than 50%  respiratory variability, suggesting right atrial pressure of 3 mmHg.   Stress Testing: 08/2021 1. No reversible ischemia. Small fixed defect is noted involving the apical anterior and apicolateral segments. No corresponding wall motion abnormality. This is favored to represent non uniform soft tissue attenuation artifact.   2. Normal left ventricular wall motion.   3. Left ventricular ejection fraction 64%   4. Non invasive risk stratification*: Low  Cardiac monitor: 09/2022 Patient had a min HR  of 45 bpm, max HR of 149 bpm, and avg HR of 83 bpm. Predominant underlying rhythm was Sinus Rhythm. Isolated SVEs were rare (<1.0%), SVE Couplets were rare (<1.0%), and no SVE Triplets were present.  Isolated VEs were rare (<1.0%), VE  Couplets were rare (<1.0%), and no VE Triplets were present.  RADIOLOGY: NA  Risk Assessment/Calculations:   NA   Labs:       Latest Ref Rng & Units 05/14/2022    1:01 PM 09/25/2021    3:32 AM 09/24/2021   11:57 AM  CBC  WBC 4.0 - 10.5 K/uL 3.8  4.4  4.0   Hemoglobin 13.0 - 17.0 g/dL 84.9  86.4  85.0   Hematocrit 39.0 - 52.0 % 43.4  39.7  44.3   Platelets 150 - 400 K/uL 239  234  276        Latest Ref Rng & Units 05/14/2022    1:01 PM 09/25/2021    3:32 AM 09/24/2021   11:57 AM  BMP  Glucose 70 - 99 mg/dL 99  892  99   BUN 6 - 20 mg/dL 10  11  10    Creatinine 0.61 - 1.24 mg/dL 8.96  9.05  8.97   Sodium 135 - 145 mmol/L 138  140  140   Potassium 3.5 - 5.1 mmol/L 4.0  3.6  4.4   Chloride 98 - 111 mmol/L 99  109  107   CO2 22 - 32 mmol/L 27  26  27    Calcium 8.9 - 10.3 mg/dL 89.4  8.7  9.4       Latest Ref Rng & Units 05/14/2022    1:01 PM 09/25/2021    3:32 AM 09/24/2021   10:29 PM  CMP  Glucose 70 - 99 mg/dL 99  892    BUN 6 - 20 mg/dL 10  11    Creatinine 9.38 - 1.24 mg/dL 8.96  9.05    Sodium 864 - 145 mmol/L 138  140    Potassium 3.5 - 5.1 mmol/L 4.0  3.6    Chloride 98 - 111 mmol/L 99  109    CO2 22 - 32 mmol/L 27  26    Calcium 8.9 - 10.3 mg/dL 89.4  8.7    Total Protein 6.5 - 8.1 g/dL  5.4  6.6   Total Bilirubin 0.3 - 1.2 mg/dL  0.4  0.8   Alkaline Phos 38 - 126 U/L  41  41   AST 15 - 41 U/L  20  25   ALT 0 - 44 U/L  31  40     Lab Results  Component Value Date   CHOL 214 (H) 04/13/2023   HDL 68 04/13/2023   LDLCALC 135 (H) 04/13/2023   TRIG 61 04/13/2023   CHOLHDL 3.1 04/13/2023   No results for input(s): LIPOA in the last 8760 hours. No components found for: NTPROBNP No results for input(s): PROBNP in the last 8760 hours. Recent Labs    04/13/23 0757  TSH 4.310    Physical Exam:    Today's Vitals   02/09/24 1600  BP: 107/74  Pulse: 77  Resp: 16  SpO2: 97%  Weight: 182 lb 12.8 oz (82.9 kg)   Height: 5' 4 (1.626 m)   Body mass index is 31.38 kg/m. Wt Readings from Last 3 Encounters:  02/09/24 182 lb 12.8 oz (82.9 kg)  11/08/23 183 lb 9.6 oz (83.3 kg)  04/12/23 177 lb 9.6 oz (80.6 kg)  Physical Exam  Constitutional: No distress.  hemodynamically stable  Neck: No JVD present.  Cardiovascular: Normal rate, regular rhythm, S1 normal and S2 normal. Exam reveals no gallop, no S3 and no S4.  No murmur heard. Pulmonary/Chest: Effort normal and breath sounds normal. No stridor. He has no wheezes. He has no rales.  Musculoskeletal:        General: No edema.     Cervical back: Neck supple.  Skin: Skin is warm.   Impression & Recommendation(s):  Impression:   ICD-10-CM   1. Palpitations  R00.2 EKG 12-Lead    2. Hypothyroidism following radioiodine therapy  E89.0     3. Mixed hyperlipidemia  E78.2        Recommendation(s):  Palpitations Better controlled. In the past likely precipitated by underlying thyroid  disease. Currently working with endocrinology on titrating his dose of Synthroid . Currently has propranolol  10 mg tablets which is prescribed as as needed.  However, patient states that when he takes it he cuts the pills in quarters (for reasons unknown). Recommended taking propranolol  10 mg p.o. as needed daily for palpitations as needed.  He is educated that cutting the pills in quarters likely is ineffective and changes the pharmacokinetics and dynamics of the drug. Monitor for now  Hypothyroidism following radioiodine therapy Currently on Synthroid . Follows with endocrinology  Mixed hyperlipidemia History of hyperlipidemia. Currently not on lipid-lowering agents. LDL 126 mg/dL as of May 7974. Patient states that he had labs earlier today with his providers, not available in Care Everywhere for review today. Re emphasized importance of lifestyle modification and focusing on reducing foods that are high in cholesterol content  Discussed management of at  least 2 chronic comorbid conditions, reviewed echocardiogram and GXT from 2023, independently reviewed cardiac monitor from June 2024, EKG 02/09/2024 independently reviewed, prescription drug management, I reviewed the last progress note from Dr. Mallipeddi from 11/08/2023  Orders Placed:  Orders Placed This Encounter  Procedures   EKG 12-Lead     Final Medication List:   No orders of the defined types were placed in this encounter.   There are no discontinued medications.   Current Outpatient Medications:    Cholecalciferol (VITAMIN D3) 50 MCG (2000 UT) TABS, Take 1 tablet by mouth daily., Disp: , Rfl:    levothyroxine  (SYNTHROID ) 125 MCG tablet, Take 125 mcg by mouth as directed. Alternates with the , Disp: , Rfl:    propranolol  (INDERAL ) 10 MG tablet, Take 5 mg by mouth as needed. Typically takes a 1/4 tab when needed, Disp: , Rfl:    SUMAtriptan (IMITREX) 50 MG tablet, Take 50 mg by mouth., Disp: , Rfl:    SYNTHROID  112 MCG tablet, Take 1 tablet by mouth as directed. Alternates with the 125mcg, Disp: , Rfl:   Consent:   NA  Disposition:   1 year follow-up sooner if needed  His questions and concerns were addressed to his satisfaction. He voices understanding of the recommendations provided during this encounter.    Signed, Madonna Michele HAS, [facility]  A Division of [facility] [address], [place], [place] [address]  02/09/2024 5:17 PM

## 2024-02-09 NOTE — Patient Instructions (Signed)
 Medication Instructions:  Continue same medications *If you need a refill on your cardiac medications before your next appointment, please call your pharmacy*  Lab Work: None ordered  Testing/Procedures: None ordered  Follow-Up: At [facility], you and your health needs are our priority.  As part of our continuing mission to provide you with exceptional heart care, our providers are all part of one team.  This team includes your primary Cardiologist (physician) and Advanced Practice Providers or APPs (Physician Assistants and Nurse Practitioners) who all work together to provide you with the care you need, when you need it.  Your next appointment:  1 year    Call in May to schedule Sept appointment    Provider:  Dr.Tolia   We recommend signing up for the patient portal called MyChart.  Sign up information is provided on this After Visit Summary.  MyChart is used to connect with patients for Virtual Visits (Telemedicine).  Patients are able to view lab/test results, encounter notes, upcoming appointments, etc.  Non-urgent messages can be sent to your provider as well.   To learn more about what you can do with MyChart, go to [email or web address].

## 2024-04-30 IMAGING — NM NM THYROID IMAGING W/ UPTAKE MULTI (4&24 HR)
4 series · 4 of 4 positions shown · non-contrast
Comparison: None Available.

CLINICAL DATA: Hyperthyroidism, TSH 0.008, symptomatic

EXAM:
THYROID SCAN AND UPTAKE - 4 AND 24 HOURS
TECHNIQUE: Following oral administration of 8-7SY capsule, anterior planar
imaging was acquired at 24 hours. Thyroid uptake was calculated with
a thyroid probe at 4-6 hours and 24 hours.
RADIOPHARMACEUTICALS:  463 uCi 8-7SY sodium iodide p.o.

[Series 1: anterior · 1.18mm/px · 1 of 1 slices shown]
[im 1/1]
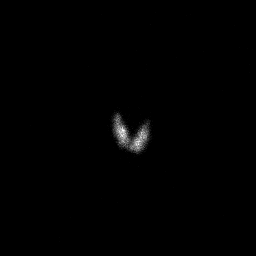

[Series 2: ant w marker · 1.18mm/px · 1 of 1 slices shown]
[im 1/1]
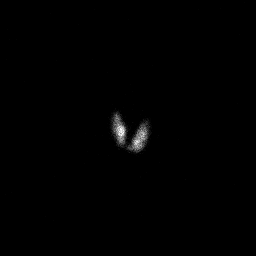

[Series 3: lao · 1.18mm/px · 1 of 1 slices shown]
[im 1/1  full-range]
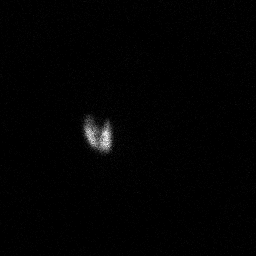

[Series 4: rao · 1.18mm/px · 1 of 1 slices shown]
[im 1/1]
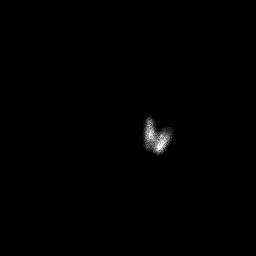

[4 of 4 positions shown; findings below may reference images not displayed]

FINDINGS: Homogeneous tracer distribution in both thyroid lobes.

No focal areas of increased or decreased tracer localization.

4 hour 8-7SY uptake = 34.1% (normal 5-20%)

24 hour 8-7SY uptake = 45.7% (normal 10-30%)
IMPRESSION: Normal thyroid scan with elevated 4 hour and 24 hour radio iodine
uptakes.

Findings are consistent with Graves disease.

## 2024-05-07 IMAGING — NM NM RAI THERAPY FOR HYPERTHYROIDISM
1 series · 1 of 1 positions shown · non-contrast
Comparison: November 06, 2021.

CLINICAL DATA: Hyperthyroidism from Graves disease.

EXAM:
RADIOACTIVE IODINE THERAPY FOR HYPERTHYROIDISM
TECHNIQUE: Radioactive iodine prescribed by Dr. Sirio. The risks and benefits
of radioactive iodine therapy were discussed with the patient in
detail by Dr. Sirio. Alternative therapies were also mentioned.
Radiation safety was discussed with the patient, including how to
protect the general public from exposure. There were no barriers to
communication. Written consent was obtained. The patient then
received a capsule containing the radiopharmaceutical.
The patient will follow-up with the referring physician.
RADIOPHARMACEUTICALS:  21.6 mCi 3-0M0 sodium iodide orally

[Series 1: bone statics · 2.07mm/px · 1 of 1 slices shown]
[im 1/1]
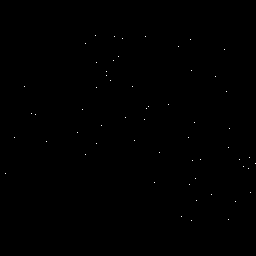

[1 of 1 positions shown; findings below may reference images not displayed]

IMPRESSION: Per oral administration of 3-0M0 sodium iodide for the treatment of
hyperthyroidism.

## 2024-05-15 ENCOUNTER — Other Ambulatory Visit (HOSPITAL_COMMUNITY): Payer: Self-pay | Admitting: Family Medicine

## 2024-05-15 DIAGNOSIS — R52 Pain, unspecified: Secondary | ICD-10-CM

## 2024-05-15 DIAGNOSIS — Z041 Encounter for examination and observation following transport accident: Secondary | ICD-10-CM

## 2024-05-18 ENCOUNTER — Ambulatory Visit (HOSPITAL_COMMUNITY): Admission: RE | Admit: 2024-05-18 | Source: Ambulatory Visit

## 2024-05-18 ENCOUNTER — Ambulatory Visit (HOSPITAL_COMMUNITY)
Admission: RE | Admit: 2024-05-18 | Discharge: 2024-05-18 | Disposition: A | Source: Ambulatory Visit | Attending: Family Medicine | Admitting: Family Medicine

## 2024-05-18 ENCOUNTER — Other Ambulatory Visit (HOSPITAL_COMMUNITY): Payer: Self-pay | Admitting: Family Medicine

## 2024-05-18 DIAGNOSIS — M542 Cervicalgia: Secondary | ICD-10-CM | POA: Insufficient documentation

## 2024-05-18 DIAGNOSIS — Z041 Encounter for examination and observation following transport accident: Secondary | ICD-10-CM | POA: Insufficient documentation

## 2024-05-18 DIAGNOSIS — R52 Pain, unspecified: Secondary | ICD-10-CM | POA: Insufficient documentation
